# Patient Record
Sex: Female | Born: 1985 | Race: White | Hispanic: No | Marital: Married | Smoking: Never smoker
Health system: Southern US, Community
[De-identification: ages and names within clinical notes are randomized; demographics above are authoritative.]

## PROBLEM LIST (undated history)

## (undated) DIAGNOSIS — B379 Candidiasis, unspecified: Secondary | ICD-10-CM

## (undated) DIAGNOSIS — Z789 Other specified health status: Secondary | ICD-10-CM

## (undated) HISTORY — PX: NO PAST SURGERIES: SHX2092

---

## 2012-10-03 LAB — OB RESULTS CONSOLE GC/CHLAMYDIA
Chlamydia: NEGATIVE
Gonorrhea: NEGATIVE

## 2012-10-03 LAB — OB RESULTS CONSOLE ANTIBODY SCREEN: Antibody Screen: NEGATIVE

## 2012-10-03 LAB — OB RESULTS CONSOLE RUBELLA ANTIBODY, IGM: Rubella: IMMUNE

## 2012-12-07 NOTE — L&D Delivery Note (Signed)
Delivery Note At 11:08 AM a healthy female was delivered via Vaginal, Spontaneous Delivery (Presentation: Right Occiput Anterior).  APGAR: 8, 9; weight .   Placenta status: Intact, Spontaneous.  Cord: 3 vessels with the following complications: None.  Cord pH: none  Anesthesia: None  Episiotomy: None Lacerations: 2nd degree;Perineal Suture Repair: vicryl rapide Est. Blood Loss (mL):   Mom to postpartum.  Baby to nursery-stable.  Betsabe Iglesia,MARIE-LYNE 05/04/2013, 11:52 AM

## 2013-01-20 ENCOUNTER — Inpatient Hospital Stay (HOSPITAL_COMMUNITY): Admission: AD | Admit: 2013-01-20 | Payer: Self-pay | Source: Ambulatory Visit | Admitting: Obstetrics

## 2013-03-31 LAB — OB RESULTS CONSOLE GBS: GBS: NEGATIVE

## 2013-05-04 ENCOUNTER — Encounter (HOSPITAL_COMMUNITY): Payer: Self-pay | Admitting: Obstetrics and Gynecology

## 2013-05-04 ENCOUNTER — Inpatient Hospital Stay (HOSPITAL_COMMUNITY)
Admission: AD | Admit: 2013-05-04 | Discharge: 2013-05-06 | DRG: 372 | Disposition: A | Discharge status: Home / Self Care | Payer: BC Managed Care – PPO | Source: Ambulatory Visit | Attending: Obstetrics & Gynecology | Admitting: Obstetrics & Gynecology

## 2013-05-04 DIAGNOSIS — D62 Acute posthemorrhagic anemia: Secondary | ICD-10-CM | POA: Diagnosis not present

## 2013-05-04 DIAGNOSIS — O9903 Anemia complicating the puerperium: Secondary | ICD-10-CM | POA: Diagnosis not present

## 2013-05-04 DIAGNOSIS — O3660X0 Maternal care for excessive fetal growth, unspecified trimester, not applicable or unspecified: Secondary | ICD-10-CM | POA: Diagnosis present

## 2013-05-04 DIAGNOSIS — O99814 Abnormal glucose complicating childbirth: Secondary | ICD-10-CM | POA: Diagnosis present

## 2013-05-04 HISTORY — DX: Other specified health status: Z78.9

## 2013-05-04 LAB — CBC
Platelets: 124 10*3/uL — ABNORMAL LOW (ref 150–400)
RBC: 4.95 MIL/uL (ref 3.87–5.11)
RDW: 15 % (ref 11.5–15.5)
WBC: 10.8 10*3/uL — ABNORMAL HIGH (ref 4.0–10.5)

## 2013-05-04 LAB — RPR: RPR Ser Ql: NONREACTIVE

## 2013-05-04 MED ORDER — IBUPROFEN 600 MG PO TABS
600.0000 mg | ORAL_TABLET | Freq: Four times a day (QID) | ORAL | Status: DC
  Administered 2013-05-04 – 2013-05-06 (×7): 600 mg via ORAL
  Filled 2013-05-04 (×7): qty 1

## 2013-05-04 MED ORDER — ONDANSETRON HCL 4 MG/2ML IJ SOLN: 4.0000 mg | INTRAMUSCULAR | Status: DC | PRN

## 2013-05-04 MED ORDER — DIBUCAINE 1 % RE OINT
1.0000 "application " | TOPICAL_OINTMENT | RECTAL | Status: DC | PRN
  Administered 2013-05-04: 1 via RECTAL
  Filled 2013-05-04: qty 28

## 2013-05-04 MED ORDER — SIMETHICONE 80 MG PO CHEW: 80.0000 mg | CHEWABLE_TABLET | ORAL | Status: DC | PRN

## 2013-05-04 MED ORDER — OXYTOCIN BOLUS FROM INFUSION: 500.0000 mL | INTRAVENOUS | Status: DC

## 2013-05-04 MED ORDER — OXYCODONE-ACETAMINOPHEN 5-325 MG PO TABS: 1.0000 | ORAL_TABLET | ORAL | Status: DC | PRN

## 2013-05-04 MED ORDER — TETANUS-DIPHTH-ACELL PERTUSSIS 5-2.5-18.5 LF-MCG/0.5 IM SUSP: 0.5000 mL | Freq: Once | INTRAMUSCULAR | Status: DC

## 2013-05-04 MED ORDER — LIDOCAINE HCL (PF) 1 % IJ SOLN
30.0000 mL | INTRAMUSCULAR | Status: AC | PRN
  Filled 2013-05-04: qty 30

## 2013-05-04 MED ORDER — OXYTOCIN 10 UNIT/ML IJ SOLN
INTRAMUSCULAR | Status: AC
  Administered 2013-05-04: 20 [IU] via INTRAMUSCULAR
  Filled 2013-05-04: qty 2

## 2013-05-04 MED ORDER — LANOLIN HYDROUS EX OINT: TOPICAL_OINTMENT | CUTANEOUS | Status: DC | PRN

## 2013-05-04 MED ORDER — IBUPROFEN 600 MG PO TABS
600.0000 mg | ORAL_TABLET | Freq: Four times a day (QID) | ORAL | Status: DC | PRN
  Administered 2013-05-04: 600 mg via ORAL
  Filled 2013-05-04: qty 1

## 2013-05-04 MED ORDER — ACETAMINOPHEN 325 MG PO TABS: 650.0000 mg | ORAL_TABLET | ORAL | Status: DC | PRN

## 2013-05-04 MED ORDER — SENNOSIDES-DOCUSATE SODIUM 8.6-50 MG PO TABS: 2.0000 | ORAL_TABLET | Freq: Every day | ORAL | Status: DC

## 2013-05-04 MED ORDER — DIPHENHYDRAMINE HCL 25 MG PO CAPS: 25.0000 mg | ORAL_CAPSULE | Freq: Four times a day (QID) | ORAL | Status: DC | PRN

## 2013-05-04 MED ORDER — ZOLPIDEM TARTRATE 5 MG PO TABS: 5.0000 mg | ORAL_TABLET | Freq: Every evening | ORAL | Status: DC | PRN

## 2013-05-04 MED ORDER — PRENATAL MULTIVITAMIN CH
1.0000 | ORAL_TABLET | Freq: Every day | ORAL | Status: DC
  Administered 2013-05-05: 1 via ORAL
  Filled 2013-05-04: qty 1

## 2013-05-04 MED ORDER — LIDOCAINE HCL (PF) 1 % IJ SOLN
INTRAMUSCULAR | Status: AC
  Administered 2013-05-04: 30 mL via SUBCUTANEOUS
  Filled 2013-05-04: qty 30

## 2013-05-04 MED ORDER — ONDANSETRON HCL 4 MG/2ML IJ SOLN: 4.0000 mg | Freq: Four times a day (QID) | INTRAMUSCULAR | Status: DC | PRN

## 2013-05-04 MED ORDER — BENZOCAINE-MENTHOL 20-0.5 % EX AERO
1.0000 "application " | INHALATION_SPRAY | CUTANEOUS | Status: DC | PRN
  Administered 2013-05-04: 1 via TOPICAL
  Filled 2013-05-04: qty 56

## 2013-05-04 MED ORDER — OXYTOCIN 40 UNITS IN LACTATED RINGERS INFUSION - SIMPLE MED
INTRAVENOUS | Status: AC
  Filled 2013-05-04: qty 1000

## 2013-05-04 MED ORDER — OXYTOCIN 40 UNITS IN LACTATED RINGERS INFUSION - SIMPLE MED: 62.5000 mL/h | INTRAVENOUS | Status: DC | PRN

## 2013-05-04 MED ORDER — ONDANSETRON HCL 4 MG PO TABS: 4.0000 mg | ORAL_TABLET | ORAL | Status: DC | PRN

## 2013-05-04 MED ORDER — LACTATED RINGERS IV SOLN
INTRAVENOUS | Status: DC
  Administered 2013-05-04: 11:00:00 via INTRAVENOUS

## 2013-05-04 MED ORDER — LACTATED RINGERS IV SOLN: 500.0000 mL | INTRAVENOUS | Status: DC | PRN

## 2013-05-04 MED ORDER — OXYTOCIN 40 UNITS IN LACTATED RINGERS INFUSION - SIMPLE MED: 62.5000 mL/h | INTRAVENOUS | Status: DC

## 2013-05-04 MED ORDER — CITRIC ACID-SODIUM CITRATE 334-500 MG/5ML PO SOLN: 30.0000 mL | ORAL | Status: DC | PRN

## 2013-05-04 MED ORDER — WITCH HAZEL-GLYCERIN EX PADS
1.0000 "application " | MEDICATED_PAD | CUTANEOUS | Status: DC | PRN
  Administered 2013-05-04: 1 via TOPICAL

## 2013-05-04 MED ORDER — FLEET ENEMA 7-19 GM/118ML RE ENEM: 1.0000 | ENEMA | RECTAL | Status: DC | PRN

## 2013-05-04 NOTE — Progress Notes (Signed)
1915 BP 143/97 No headaches or blurred vision or any other complaints, 2054 Recheck BP 138/96 Contacted Dr. Cherly Hensen, no orders given.  Will continue to monitor. Dahlia Byes Boschen

## 2013-05-04 NOTE — H&P (Signed)
Amanda Shannon is a 27 y.o. female G2P1 110w3d presenting for Spontaneous labor, advanced dilation.  HPP:  GDMA1 well controled.  OB History   Grav Para Term Preterm Abortions TAB SAB Ect Mult Living   2 1             Past Medical History  Diagnosis Date  . Medical history non-contributory    Past Surgical History  Procedure Laterality Date  . No past surgeries     Family History: family history is not on file. Social History:  has no tobacco, alcohol, and drug history on file. Current facility-administered medications:lidocaine (PF) (XYLOCAINE) 1 % injection, , , , ;  oxytocin 40 units in LR 1000 mL 40 units/1000 mL infusion, , , ,  Allergies  Allergen Reactions  . Amoxicillin Rash    Dilation: 9 Effacement (%): 90 Station: -1 Exam by:: Dr. Seymour Bars Blood pressure 129/77, pulse 89, temperature 97.7 F (36.5 C), temperature source Oral, resp. rate 20.  Just reexamined, VE 9+/100%/Vtx/0  AROM AF clear.  HPP: There are no active problems to display for this patient.   Prenatal labs: ABO, Rh:  pos Antibody:  Neg Rubella:  Immune RPR:  NR HBsAg:  NR  HIV:  NR  Genetic testing: wnl [place] anato: Isolated EIF, anato detailed wnl. GDM diet, well controled GBS:  neg  Assessment/Plan: 40+ wks active labor, advanced dilation.  AROM clear AF.  F-well being reassuring. H/O macrosomia with SVD.  GDMA1 well controled.  Preparing for VD.   Ilze Roselli,MARIE-LYNE 05/04/2013, 10:55 AM

## 2013-05-04 NOTE — Progress Notes (Signed)
Education done on skin to skin for one hour and she still requested for the baby to be weighed and measured before she tried to breast feed

## 2013-05-05 LAB — ABO/RH: ABO/RH(D): O POS

## 2013-05-05 LAB — CBC
Hemoglobin: 10.9 g/dL — ABNORMAL LOW (ref 12.0–15.0)
MCH: 27.9 pg (ref 26.0–34.0)
MCV: 82.8 fL (ref 78.0–100.0)
RBC: 3.9 MIL/uL (ref 3.87–5.11)
WBC: 12.6 10*3/uL — ABNORMAL HIGH (ref 4.0–10.5)

## 2013-05-05 NOTE — Progress Notes (Addendum)
PPD # 1 S/p SVD with 2nd deg lac. Had episode of isolated mild hypertension yesterday that is resolved this AM. Subjective: Pt reports feeling well/ Pain controlled with ibuprofen (OTC) Tolerating po/ Voiding without problems/ No n/v Bleeding is light Newborn info:  Information for the patient's newborn:  Amie, Cowens [[id number]]  female Feeding: breast   Objective:  VS: Blood pressure 105/68, pulse 71, temperature 98.6 F (37 C), temperature source Oral, resp. rate 18, height 5\' 8"  (1.727 m), weight 170 lb (77.111 kg), SpO2 100.00%, unknown if currently breastfeeding.    Recent Labs  05/04/13 1040 05/05/13 0605  WBC 10.8* 12.6*  HGB 13.9 10.9*  HCT 40.5 32.3*  PLT 124* 153    Blood type: --/--/O POS (05/29 1040) Rubella: Immune (10/28 0000)    Physical Exam:  General:  alert, cooperative and no distress CV: Regular rate and rhythm Resp: clear Abdomen: soft, nontender, normal bowel sounds Uterine Fundus: firm, below umbilicus, nontender Perineum: healing with good reapproximation Lochia: minimal Ext: edema trace and Homans sign is negative, no sign of DVT   A/P: PPD # 1/ G3P2012/ S/P:spontaneous vaginal with 2nd deg lac Mild ABL anemia Doing well Continue routine post partum orders Anticipate D/C home in AM    TEDDER, Elisha Headland, RN, FNP Student   05/05/2013, 9:13 AM  Reviewed: Arlana Lindau, RN, [facility] 05/05/13

## 2013-05-06 ENCOUNTER — Ambulatory Visit: Payer: Self-pay

## 2013-05-06 MED ORDER — IBUPROFEN 600 MG PO TABS: 600.0000 mg | ORAL_TABLET | Freq: Four times a day (QID) | ORAL | Status: DC

## 2013-05-06 MED ORDER — OXYCODONE-ACETAMINOPHEN 5-325 MG PO TABS
1.0000 | ORAL_TABLET | ORAL | Status: DC | PRN
Start: 2013-05-06 — End: 2013-08-15

## 2013-05-06 NOTE — Progress Notes (Signed)
PPD 2 SVD  S:  Reports feeling ok - tired mostly             Tolerating po/ No nausea or vomiting             Bleeding is light             Pain controlled with motrin and percocet             Up ad lib / ambulatory / voiding QS  Newborn breast feeding  / female O:               VS: BP 92/59  Pulse 71  Temp(Src) 97.8 F (36.6 C) (Oral)  Resp 20  Ht 5\' 8"  (1.727 m)  Wt 77.111 kg (170 lb)  BMI 25.85 kg/m2  SpO2 100%   LABS:  Recent Labs  05/04/13 1040 05/05/13 0605  WBC 10.8* 12.6*  HGB 13.9 10.9*  PLT 124* 153                          Physical Exam:             Alert and oriented X3  Abdomen: soft, non-tender, non-distended              Fundus: firm, non-tender, U-2  Perineum: mild edema only  Lochia: light  Extremities: no edema, no calf pain or tenderness    A: PPD # 2   Doing well - stable status  P:  Routine post partum orders  Discharge to home - WOB booklet - instructions reviewed  Marlinda Mike CNM, MSN, [facility] 05/06/2013, 9:27 AM

## 2013-05-06 NOTE — Discharge Summary (Signed)
Obstetric Discharge Summary  Reason for Admission: onset of labor Prenatal Procedures: none Intrapartum Procedures: spontaneous vaginal delivery Postpartum Procedures: none Complications-Operative and Postpartum: 2nd degree perineal laceration Hemoglobin  Date Value Range Status  05/05/2013 10.9* 12.0 - 15.0 g/dL Final     DELTA CHECK NOTED     REPEATED TO VERIFY     HCT  Date Value Range Status  05/05/2013 32.3* 36.0 - 46.0 % Final    Physical Exam:  General: alert, cooperative and no distress Lochia: appropriate Uterine Fundus: firm Incision: healing well DVT Evaluation: No evidence of DVT seen on physical exam.  Discharge Diagnoses: Term Pregnancy-delivered  Discharge Information: Date: 05/06/2013 Activity: pelvic rest Diet: routine Medications: PNV, Ibuprofen and Percocet Condition: stable Instructions: refer to practice specific booklet Discharge to: home Follow-up Information   Follow up with LAVOIE,MARIE-LYNE, MD. Schedule an appointment as soon as possible for a visit in 6 weeks.   Contact information:   [address] [place] [address] [phone]       Newborn Data: Live born female  Birth Weight: 9 lb 4 oz (4196 g) APGAR: 8, 9  Home with mother.  Marlinda Mike 05/06/2013, 9:31 AM

## 2013-05-06 NOTE — Lactation Note (Signed)
This note was copied from the chart of Amanda Shannon. Lactation Consultation Note  Patient Name: Amanda Enriqueta Augusta NWGNF'[id number] Date: 05/06/2013 Reason for consult: Follow-up assessment   Maternal Data    Feeding   LATCH Score/Interventions    Lactation Tools Discussed/Used     Consult Status Consult Status: Complete  Mom reports that nipples are pretty tender but intact. Comfort gels given with instructions for use and cleaning. No questions at present. To call prn  Pamelia Hoit 05/06/2013, 11:43 AM

## 2013-05-12 NOTE — Discharge Summary (Signed)
   Obstetric Discharge Summary Reason for Admission: onset of labor Prenatal Procedures: none Intrapartum Procedures: spontaneous vaginal delivery Postpartum Procedures: none Complications-Operative and Postpartum: none    Hemoglobin  Date Value Range Status  05/05/2013 10.9* 12.0 - 15.0 g/dL Final     DELTA CHECK NOTED     REPEATED TO VERIFY     HCT  Date Value Range Status  05/05/2013 32.3* 36.0 - 46.0 % Final    Hospital Course:  SVD, no complication.  Discharge Diagnoses: Term Pregnancy-delivered  Discharge Information: Date: 05/12/2013 Activity: unrestricted Diet: routine Medications:    Medication List    STOP taking these medications       calcium carbonate 500 MG chewable tablet  Commonly known as:  TUMS - dosed in mg elemental calcium      TAKE these medications       CRANBERRY PO  Take 2 tablets by mouth daily as needed (To prevent UTI).     ibuprofen 600 MG tablet  Commonly known as:  ADVIL,MOTRIN  Take 1 tablet (600 mg total) by mouth every 6 (six) hours.     oxyCODONE-acetaminophen 5-325 MG per tablet  Commonly known as:  PERCOCET/ROXICET  Take 1 tablet by mouth every 4 (four) hours as needed.     prenatal multivitamin Tabs  Take 1 tablet by mouth daily at 12 noon.       Condition: stable Instructions: refer to practice specific booklet Discharge to: home     Follow-up Information   Follow up with Bessy Reaney,MARIE-LYNE, MD. Schedule an appointment as soon as possible for a visit in 6 weeks.   Contact information:   [address] [place] [address] [phone]       Newborn Data: Live born  Information for the patient's newborn:  Natoria, Archibald [[id number]]  female ; APGAR 8 , 9 ; weight  Home with mother.  Jaben Benegas,MARIE-LYNE 05/12/2013, 11:31 AM

## 2013-08-15 ENCOUNTER — Emergency Department (HOSPITAL_COMMUNITY)
Admission: EM | Admit: 2013-08-15 | Discharge: 2013-08-15 | Disposition: A | Discharge status: Home / Self Care | Payer: BC Managed Care – PPO | Attending: Emergency Medicine | Admitting: Emergency Medicine

## 2013-08-15 ENCOUNTER — Encounter (HOSPITAL_COMMUNITY): Payer: Self-pay | Admitting: *Deleted

## 2013-08-15 DIAGNOSIS — R10816 Epigastric abdominal tenderness: Secondary | ICD-10-CM | POA: Insufficient documentation

## 2013-08-15 DIAGNOSIS — K219 Gastro-esophageal reflux disease without esophagitis: Secondary | ICD-10-CM | POA: Insufficient documentation

## 2013-08-15 DIAGNOSIS — Z880 Allergy status to penicillin: Secondary | ICD-10-CM | POA: Insufficient documentation

## 2013-08-15 DIAGNOSIS — R12 Heartburn: Secondary | ICD-10-CM

## 2013-08-15 NOTE — ED Notes (Signed)
Pt reports to ed with c/o possible allergic reaction to something she ate this morning. Pt sts as soon she was finished eating she felt "like something was wrong immediately it felt like some tightness in my chest". Pt denies SOB, difficulty breathing, mouth or tongue swelling. Pt has no known food allergies and sts she had peppers, bacon, sour cream and cottage cheese this morning. VSS pt in NAD.

## 2013-08-15 NOTE — ED Provider Notes (Signed)
CSN: [id number]     Arrival date & time 08/15/13  1109 History   First MD Initiated Contact with Patient 08/15/13 1128     No chief complaint on file.  (Consider location/radiation/quality/duration/timing/severity/associated sxs/prior Treatment) HPI Comments: 27 year old healthy female presents to the emergency department concerns of an allergic reaction to something that she ate this morning. Patient states she was at a function when she ate peppers, bacon, sour cream and cottage cheese, and immediately she began feeling a discomfort in the Center for chest. She felt as if she has to move her bowels, went to the bathroom and her symptoms were slightly relieved. Currently she is asymptomatic. Denies difficulty breathing, swallowing, tongue swelling, rash. She has never had any allergic reaction to food in the past. One week ago she was eating [place] food and had the exact same symptoms. States she had heartburn when she was pregnant with her children, however this feels a little different.  The history is provided by the patient.    Past Medical History  Diagnosis Date  . Medical history non-contributory    Past Surgical History  Procedure Laterality Date  . No past surgeries     History reviewed. No pertinent family history. History  Substance Use Topics  . Smoking status: Never Smoker   . Smokeless tobacco: Not on file  . Alcohol Use: No   OB History   Grav Para Term Preterm Abortions TAB SAB Ect Mult Living   2 2 1       1      Review of Systems  Constitutional: Negative for fever and chills.  HENT: Negative for sore throat, facial swelling, trouble swallowing and voice change.   Respiratory: Negative for shortness of breath, wheezing and stridor.   Cardiovascular: Negative for palpitations.       Positive for chest discomfort.  Gastrointestinal: Negative for nausea, vomiting and abdominal pain.  Musculoskeletal: Negative for back pain.  Skin: Negative for rash.  All other  systems reviewed and are negative.    Allergies  Amoxicillin  Home Medications   Current Outpatient Rx  Name  Route  Sig  Dispense  Refill  . diphenhydrAMINE (BENADRYL) 12.5 MG chewable tablet   Oral   Chew 25 mg by mouth 4 (four) times daily as needed for allergies (allergies).         Marland Kitchen ibuprofen (ADVIL,MOTRIN) 600 MG tablet   Oral   Take 1 tablet (600 mg total) by mouth every 6 (six) hours.   30 tablet   0    BP 122/78  Pulse 71  Temp(Src) 97.9 F (36.6 C) (Oral)  Resp 16  SpO2 100%  Breastfeeding? Yes Physical Exam  Nursing note and vitals reviewed. Constitutional: She is oriented to person, place, and time. She appears well-developed and well-nourished. No distress.  HENT:  Head: Normocephalic and atraumatic.  Mouth/Throat: Uvula is midline and oropharynx is clear and moist.  Eyes: Conjunctivae are normal.  Neck: Normal range of motion. Neck supple.  Cardiovascular: Normal rate, regular rhythm and normal heart sounds.   Pulmonary/Chest: Effort normal and breath sounds normal.  Abdominal: Soft. Normal appearance and bowel sounds are normal. She exhibits no distension and no mass. There is tenderness (mild) in the epigastric area. There is no rigidity, no rebound and no guarding.  No peritoneal signs.  Musculoskeletal: Normal range of motion. She exhibits no edema.  Neurological: She is alert and oriented to person, place, and time.  Skin: Skin is warm and dry. No  rash noted. She is not diaphoretic.  Psychiatric: She has a normal mood and affect. Her behavior is normal.    ED Course  Procedures (including critical care time) Labs Review Labs Reviewed - No data to display Imaging Review No results found.  MDM   1. GERD (gastroesophageal reflux disease)   2. Heartburn    Patient with chest discomfort after eating earlier this morning. History of the same one week ago after eating [place] food. She was concerned of an allergic reaction, however no rash,  difficulty breathing, difficulty swallowing, facial swelling, tongue swelling. Her symptoms are consistent with heartburn. I advised her to take over-the-counter PPI or TUMS. I discussed the diet for heartburn. Return precautions discussed. Patient states understanding of plan and is agreeable.   Trevor Mace, PA-C 08/15/13 1209

## 2013-08-15 NOTE — ED Provider Notes (Signed)
Medical screening examination/treatment/procedure(s) were performed by non-physician practitioner and as supervising physician I was immediately available for consultation/collaboration. Devoria Albe, MD, Armando Gang   Ward Givens, MD 08/15/13 [phone]

## 2014-10-08 ENCOUNTER — Encounter (HOSPITAL_COMMUNITY): Payer: Self-pay | Admitting: *Deleted

## 2016-12-22 DIAGNOSIS — Z23 Encounter for immunization: Secondary | ICD-10-CM | POA: Diagnosis not present

## 2017-01-12 DIAGNOSIS — H11133 Conjunctival pigmentations, bilateral: Secondary | ICD-10-CM | POA: Diagnosis not present

## 2017-07-12 DIAGNOSIS — K219 Gastro-esophageal reflux disease without esophagitis: Secondary | ICD-10-CM | POA: Diagnosis not present

## 2017-07-12 DIAGNOSIS — R42 Dizziness and giddiness: Secondary | ICD-10-CM | POA: Diagnosis not present

## 2017-07-12 DIAGNOSIS — E860 Dehydration: Secondary | ICD-10-CM | POA: Diagnosis not present

## 2017-08-11 DIAGNOSIS — E738 Other lactose intolerance: Secondary | ICD-10-CM | POA: Diagnosis not present

## 2017-08-11 DIAGNOSIS — Z6820 Body mass index (BMI) 20.0-20.9, adult: Secondary | ICD-10-CM | POA: Diagnosis not present

## 2017-08-11 DIAGNOSIS — K219 Gastro-esophageal reflux disease without esophagitis: Secondary | ICD-10-CM | POA: Diagnosis not present

## 2017-11-22 DIAGNOSIS — E559 Vitamin D deficiency, unspecified: Secondary | ICD-10-CM | POA: Diagnosis not present

## 2017-11-22 DIAGNOSIS — Z0000 Encounter for general adult medical examination without abnormal findings: Secondary | ICD-10-CM | POA: Diagnosis not present

## 2017-11-24 DIAGNOSIS — Z01419 Encounter for gynecological examination (general) (routine) without abnormal findings: Secondary | ICD-10-CM | POA: Diagnosis not present

## 2017-11-24 DIAGNOSIS — Z0000 Encounter for general adult medical examination without abnormal findings: Secondary | ICD-10-CM | POA: Diagnosis not present

## 2017-11-24 DIAGNOSIS — Z6820 Body mass index (BMI) 20.0-20.9, adult: Secondary | ICD-10-CM | POA: Diagnosis not present

## 2017-11-24 DIAGNOSIS — Z118 Encounter for screening for other infectious and parasitic diseases: Secondary | ICD-10-CM | POA: Diagnosis not present

## 2018-01-14 DIAGNOSIS — H11133 Conjunctival pigmentations, bilateral: Secondary | ICD-10-CM | POA: Diagnosis not present

## 2018-05-23 ENCOUNTER — Ambulatory Visit (HOSPITAL_COMMUNITY)
Admission: EM | Admit: 2018-05-23 | Discharge: 2018-05-23 | Disposition: A | Discharge status: Home / Self Care | Payer: 59 | Attending: Internal Medicine | Admitting: Internal Medicine

## 2018-05-23 ENCOUNTER — Encounter (HOSPITAL_COMMUNITY): Payer: Self-pay | Admitting: Family Medicine

## 2018-05-23 DIAGNOSIS — R42 Dizziness and giddiness: Secondary | ICD-10-CM

## 2018-05-23 DIAGNOSIS — R110 Nausea: Secondary | ICD-10-CM

## 2018-05-23 DIAGNOSIS — R112 Nausea with vomiting, unspecified: Secondary | ICD-10-CM

## 2018-05-23 DIAGNOSIS — Z3202 Encounter for pregnancy test, result negative: Secondary | ICD-10-CM

## 2018-05-23 LAB — POCT I-STAT, CHEM 8
BUN: 9 mg/dL (ref 6–20)
CALCIUM ION: 1.22 mmol/L (ref 1.15–1.40)
CREATININE: 0.7 mg/dL (ref 0.44–1.00)
Chloride: 106 mmol/L (ref 101–111)
GLUCOSE: 92 mg/dL (ref 65–99)
HCT: 42 % (ref 36.0–46.0)
HEMOGLOBIN: 14.3 g/dL (ref 12.0–15.0)
Potassium: 4.2 mmol/L (ref 3.5–5.1)
Sodium: 141 mmol/L (ref 135–145)
TCO2: 23 mmol/L (ref 22–32)

## 2018-05-23 LAB — POCT PREGNANCY, URINE: PREG TEST UR: NEGATIVE

## 2018-05-23 MED ORDER — MECLIZINE HCL 25 MG PO TABS: 12.5000 mg | ORAL_TABLET | Freq: Three times a day (TID) | ORAL | 0 refills | Status: DC | PRN

## 2018-05-23 NOTE — ED Provider Notes (Addendum)
MRN: [id number] DOB: [birth year]  Subjective:   Amanda Shannon is a 32 y.o. female presenting for 2 day history of constant dizziness, nausea without vomiting. Has had mild loose stools today but admits that she has a hard time with dairy, ate some last night. Has had mild chest pain, slight heart fluttering. Denies fever, vomiting, throat pain, cough, rashes, vertigo, tinnitus, ear pain. Has been hydrating aggressively, feels like this has cause belly pain.  Admits that she has a difficult time hydrating well.  Has not tried any medications for relief. LMP was last week 05/17/2018, was regular. Had a similar episode of dizziness last year.   No current facility-administered medications for this encounter.   Current Outpatient Medications:  .  diphenhydrAMINE (BENADRYL) 12.5 MG chewable tablet, Chew 25 mg by mouth 4 (four) times daily as needed for allergies (allergies)., Disp: , Rfl:    Allergies  Allergen Reactions  . Amoxicillin Rash    Past Medical History:  Diagnosis Date  . Medical history non-contributory      Past Surgical History:  Procedure Laterality Date  . NO PAST SURGERIES      Objective:   Vitals: BP 125/74   Pulse 80   Temp 98.4 F (36.9 C)   Resp 18   LMP 05/17/2018   SpO2 100%   Breastfeeding? No   BP Readings from Last 3 Encounters:  05/23/18 125/74  08/15/13 122/78  05/06/13 (!) 92/59    Orthostatic Vital Signs BP- Lying:119/87 Pulse- Lying:72 BP- Sitting:121/85 Pulse- Sitting:75 BP- Standing at 0 minutes:117/87 Pulse- Standing at 0 minutes:74  Physical Exam  Constitutional: She is oriented to person, place, and time. She appears well-developed and well-nourished.  HENT:  Mouth/Throat: Oropharynx is clear and moist.  Eyes: No scleral icterus.  Cardiovascular: Normal rate, regular rhythm, normal heart sounds and intact distal pulses. Exam reveals no gallop and no friction rub.  No murmur heard. Pulmonary/Chest: Effort normal and breath  sounds normal. No respiratory distress. She has no wheezes. She has no rales.  Neurological: She is alert and oriented to person, place, and time.  Skin: Skin is warm and dry.  Psychiatric: She has a normal mood and affect.   Results for orders placed or performed during the hospital encounter of 05/23/18 (from the past 24 hour(s))  Pregnancy, urine POC     Status: None   Collection Time: 05/23/18  4:57 PM  Result Value Ref Range   Preg Test, Ur NEGATIVE NEGATIVE  I-STAT, chem 8     Status: None   Collection Time: 05/23/18  5:21 PM  Result Value Ref Range   Sodium 141 135 - 145 mmol/L   Potassium 4.2 3.5 - 5.1 mmol/L   Chloride 106 101 - 111 mmol/L   BUN 9 6 - 20 mg/dL   Creatinine, Ser 0.70 0.44 - 1.00 mg/dL   Glucose, Bld 92 65 - 99 mg/dL   Calcium, Ion 1.22 1.15 - 1.40 mmol/L   TCO2 23 22 - 32 mmol/L   Hemoglobin 14.3 12.0 - 15.0 g/dL   HCT 42.0 36.0 - 46.0 %   Assessment and Plan :   Dizziness  Nausea without vomiting  Zickel exam findings and vitals are very reassuring, point-of-care test normal.  Recommended patient rest and hydrate well.  For supportive care will use meclizine. Counseled patient on potential for adverse effects with medications prescribed today, patient verbalized understanding.  Patient is to follow-up with PCP.   Jaynee Eagles, PA-C 05/23/18 1747  Jaynee Eagles, [place] 05/23/18 [phone]

## 2018-05-23 NOTE — ED Triage Notes (Signed)
Pt here for dizziness for the past few days. She had a recent trip to the beach and this started after. Denies any pain, vomiting or diarrhea. Denies fever. She has been eating and drinking okay. She has been nauseous.

## 2018-05-23 NOTE — Discharge Instructions (Signed)
Hydrate well with at least 2 liters (1 gallon) of water daily.  °

## 2018-05-26 DIAGNOSIS — R42 Dizziness and giddiness: Secondary | ICD-10-CM | POA: Diagnosis not present

## 2018-06-29 DIAGNOSIS — Z6822 Body mass index (BMI) 22.0-22.9, adult: Secondary | ICD-10-CM | POA: Diagnosis not present

## 2018-07-14 DIAGNOSIS — J069 Acute upper respiratory infection, unspecified: Secondary | ICD-10-CM | POA: Diagnosis not present

## 2018-12-10 ENCOUNTER — Other Ambulatory Visit: Payer: Self-pay

## 2018-12-10 ENCOUNTER — Ambulatory Visit (HOSPITAL_COMMUNITY)
Admission: EM | Admit: 2018-12-10 | Discharge: 2018-12-10 | Disposition: A | Discharge status: Home / Self Care | Payer: 59 | Attending: Family Medicine | Admitting: Family Medicine

## 2018-12-10 ENCOUNTER — Encounter (HOSPITAL_COMMUNITY): Payer: Self-pay | Admitting: *Deleted

## 2018-12-10 DIAGNOSIS — B9789 Other viral agents as the cause of diseases classified elsewhere: Secondary | ICD-10-CM | POA: Insufficient documentation

## 2018-12-10 DIAGNOSIS — J069 Acute upper respiratory infection, unspecified: Secondary | ICD-10-CM | POA: Insufficient documentation

## 2018-12-10 MED ORDER — FLUTICASONE PROPIONATE 50 MCG/ACT NA SUSP: 2.0000 | Freq: Every day | NASAL | 0 refills | Status: DC

## 2018-12-10 MED ORDER — BENZONATATE 100 MG PO CAPS: 100.0000 mg | ORAL_CAPSULE | Freq: Three times a day (TID) | ORAL | 0 refills | Status: DC

## 2018-12-10 MED ORDER — IPRATROPIUM BROMIDE 0.06 % NA SOLN: 2.0000 | Freq: Four times a day (QID) | NASAL | 0 refills | Status: DC

## 2018-12-10 NOTE — ED Provider Notes (Signed)
[place]    CSN: [id number] Arrival date & time: 12/10/18  1723     History   Chief Complaint Chief Complaint  Patient presents with  . Cough    HPI Amanda Shannon is a 33 y.o. female.   33 year old female comes in for 4 to 5-day history of URI symptoms.  Has had rhinorrhea, nasal congestion, cough, voice hoarseness.  States today, feels tired, "woozy".  Has generalized body aches and mild dizziness.  Denies fever, chills, night sweats.  Denies abdominal pain, nausea, vomiting.  Has still been able to walk without difficulty.  Denies chest pain, shortness of breath or wheezing.  Has not taken anything for symptoms.     Past Medical History:  Diagnosis Date  . Medical history non-contributory     Patient Active Problem List   Diagnosis Date Noted  . SVD (spontaneous vaginal delivery) 05/04/2013  . Postpartum care following vaginal delivery (5/29) 05/04/2013    Past Surgical History:  Procedure Laterality Date  . NO PAST SURGERIES      OB History    Gravida  2   Para  2   Term  1   Preterm      AB      Living  1     SAB      TAB      Ectopic      Multiple      Live Births  1            Home Medications    Prior to Admission medications   Medication Sig Start Date End Date Taking? Authorizing Provider  benzonatate (TESSALON) 100 MG capsule Take 1 capsule (100 mg total) by mouth every 8 (eight) hours. 12/10/18   Tasia Catchings, Declan V, PA-C  diphenhydrAMINE (BENADRYL) 12.5 MG chewable tablet Chew 25 mg by mouth 4 (four) times daily as needed for allergies (allergies).    [provider]  fluticasone (FLONASE) 50 MCG/ACT nasal spray Place 2 sprays into both nostrils daily. 12/10/18   Tasia Catchings, Dioselina V, PA-C  ipratropium (ATROVENT) 0.06 % nasal spray Place 2 sprays into both nostrils 4 (four) times daily. 12/10/18   Ok Edwards, PA-C  meclizine (ANTIVERT) 25 MG tablet Take 0.5-1 tablets (12.5-25 mg total) by mouth 3 (three) times daily as needed for  dizziness. 05/23/18   Jaynee Eagles, PA-C    Family History No family history on file.  Social History Social History   Tobacco Use  . Smoking status: Never Smoker  . Smokeless tobacco: Never Used  Substance Use Topics  . Alcohol use: No  . Drug use: No     Allergies   Amoxicillin   Review of Systems Review of Systems  Reason unable to perform ROS: See HPI as above.     Physical Exam Triage Vital Signs ED Triage Vitals  Enc Vitals Group     BP 12/10/18 1825 131/75     Pulse Rate 12/10/18 1825 90     Resp 12/10/18 1825 20     Temp 12/10/18 1825 98.3 F (36.8 C)     Temp Source 12/10/18 1825 Oral     SpO2 12/10/18 1825 98 %     Weight --      Height --      Head Circumference --      Peak Flow --      Pain Score 12/10/18 1827 0     Pain Loc --      Pain  Edu? --      Excl. in [place]? --    No data found.  Updated Vital Signs BP 131/75 (BP Location: Right Arm)   Pulse 90   Temp 98.3 F (36.8 C) (Oral)   Resp 20   LMP 12/08/2018 (Exact Date)   SpO2 98%   Physical Exam Constitutional:      General: She is not in acute distress.    Appearance: She is well-developed. She is not ill-appearing, toxic-appearing or diaphoretic.  HENT:     Head: Normocephalic and atraumatic.     Right Ear: Tympanic membrane, ear canal and external ear normal. Tympanic membrane is not erythematous or bulging.     Left Ear: Tympanic membrane, ear canal and external ear normal. Tympanic membrane is not erythematous or bulging.     Nose: Nose normal.     Right Sinus: No maxillary sinus tenderness or frontal sinus tenderness.     Left Sinus: No maxillary sinus tenderness or frontal sinus tenderness.     Mouth/Throat:     Pharynx: Uvula midline.  Eyes:     Conjunctiva/sclera: Conjunctivae normal.     Pupils: Pupils are equal, round, and reactive to light.  Neck:     Musculoskeletal: Normal range of motion and neck supple.  Cardiovascular:     Rate and Rhythm: Normal rate and  regular rhythm.     Heart sounds: Normal heart sounds. No murmur. No friction rub. No gallop.   Pulmonary:     Effort: Pulmonary effort is normal.     Breath sounds: Normal breath sounds. No decreased breath sounds, wheezing, rhonchi or rales.  Lymphadenopathy:     Cervical: No cervical adenopathy.  Skin:    General: Skin is warm and dry.  Neurological:     Mental Status: She is alert and oriented to person, place, and time.  Psychiatric:        Behavior: Behavior normal.        Judgment: Judgment normal.      UC Treatments / Results  Labs (all labs ordered are listed, but only abnormal results are displayed) Labs Reviewed - No data to display  EKG None  Radiology No results found.  Procedures Procedures (including critical care time)  Medications Ordered in UC Medications - No data to display  Initial Impression / Assessment and Plan / UC Course  I have reviewed the triage vital signs and the nursing notes.  Pertinent labs & imaging results that were available during my care of the patient were reviewed by me and considered in my medical decision making (see chart for details).    Discussed with patient history and exam most consistent with viral URI. Symptomatic treatment as needed. Push fluids. Return precautions given.   Final Clinical Impressions(s) / UC Diagnoses   Final diagnoses:  Viral URI with cough    ED Prescriptions    Medication Sig Dispense Auth. Provider   fluticasone (FLONASE) 50 MCG/ACT nasal spray Place 2 sprays into both nostrils daily. 1 g Vania Rosero, Shakiera V, PA-C   ipratropium (ATROVENT) 0.06 % nasal spray Place 2 sprays into both nostrils 4 (four) times daily. 15 mL Eliasar Hlavaty, Sarayah V, PA-C   benzonatate (TESSALON) 100 MG capsule Take 1 capsule (100 mg total) by mouth every 8 (eight) hours. 21 capsule [place], [place], [place] 12/10/18 1909

## 2018-12-10 NOTE — ED Triage Notes (Signed)
C/o generalized body aches with cough onset 4-5 days ago.

## 2018-12-10 NOTE — Discharge Instructions (Signed)
Tessalon for cough. Start flonase, atrovent nasal spray for nasal congestion/drainage. You can use over the counter nasal saline rinse such as neti pot for nasal congestion. Keep hydrated, your urine should be clear to pale yellow in color. Tylenol/motrin for fever and pain. Monitor for any worsening of symptoms, chest pain, shortness of breath, wheezing, swelling of the throat, follow up for reevaluation.   For sore throat/cough try using a honey-based tea. Use 3 teaspoons of honey with juice squeezed from half lemon. Place shaved pieces of ginger into 1/2-1 cup of water and warm over stove top. Then mix the ingredients and repeat every 4 hours as needed.

## 2018-12-19 DIAGNOSIS — E559 Vitamin D deficiency, unspecified: Secondary | ICD-10-CM | POA: Diagnosis not present

## 2018-12-19 DIAGNOSIS — Z0000 Encounter for general adult medical examination without abnormal findings: Secondary | ICD-10-CM | POA: Diagnosis not present

## 2018-12-21 DIAGNOSIS — Z01419 Encounter for gynecological examination (general) (routine) without abnormal findings: Secondary | ICD-10-CM | POA: Diagnosis not present

## 2018-12-21 DIAGNOSIS — Z6820 Body mass index (BMI) 20.0-20.9, adult: Secondary | ICD-10-CM | POA: Diagnosis not present

## 2018-12-21 DIAGNOSIS — Z0000 Encounter for general adult medical examination without abnormal findings: Secondary | ICD-10-CM | POA: Diagnosis not present

## 2018-12-21 DIAGNOSIS — N760 Acute vaginitis: Secondary | ICD-10-CM | POA: Diagnosis not present

## 2018-12-21 DIAGNOSIS — Z118 Encounter for screening for other infectious and parasitic diseases: Secondary | ICD-10-CM | POA: Diagnosis not present

## 2019-02-01 DIAGNOSIS — D2361 Other benign neoplasm of skin of right upper limb, including shoulder: Secondary | ICD-10-CM | POA: Diagnosis not present

## 2019-02-01 DIAGNOSIS — D225 Melanocytic nevi of trunk: Secondary | ICD-10-CM | POA: Diagnosis not present

## 2019-02-01 DIAGNOSIS — D2262 Melanocytic nevi of left upper limb, including shoulder: Secondary | ICD-10-CM | POA: Diagnosis not present

## 2019-03-30 DIAGNOSIS — R42 Dizziness and giddiness: Secondary | ICD-10-CM | POA: Diagnosis not present

## 2019-03-30 DIAGNOSIS — Z719 Counseling, unspecified: Secondary | ICD-10-CM | POA: Diagnosis not present

## 2019-04-03 DIAGNOSIS — R42 Dizziness and giddiness: Secondary | ICD-10-CM | POA: Diagnosis not present

## 2019-04-07 DIAGNOSIS — E860 Dehydration: Secondary | ICD-10-CM | POA: Diagnosis not present

## 2019-04-07 DIAGNOSIS — R42 Dizziness and giddiness: Secondary | ICD-10-CM | POA: Diagnosis not present

## 2019-04-10 DIAGNOSIS — J309 Allergic rhinitis, unspecified: Secondary | ICD-10-CM | POA: Diagnosis not present

## 2019-04-10 DIAGNOSIS — E162 Hypoglycemia, unspecified: Secondary | ICD-10-CM | POA: Diagnosis not present

## 2019-04-10 DIAGNOSIS — R42 Dizziness and giddiness: Secondary | ICD-10-CM | POA: Diagnosis not present

## 2019-04-18 DIAGNOSIS — E162 Hypoglycemia, unspecified: Secondary | ICD-10-CM | POA: Diagnosis not present

## 2019-04-21 ENCOUNTER — Ambulatory Visit (INDEPENDENT_AMBULATORY_CARE_PROVIDER_SITE_OTHER): Payer: 59

## 2019-04-21 ENCOUNTER — Telehealth (HOSPITAL_COMMUNITY): Payer: Self-pay | Admitting: Emergency Medicine

## 2019-04-21 ENCOUNTER — Other Ambulatory Visit: Payer: Self-pay

## 2019-04-21 ENCOUNTER — Ambulatory Visit (HOSPITAL_COMMUNITY)
Admission: EM | Admit: 2019-04-21 | Discharge: 2019-04-21 | Disposition: A | Discharge status: Home / Self Care | Payer: 59 | Attending: Family Medicine | Admitting: Family Medicine

## 2019-04-21 ENCOUNTER — Encounter (HOSPITAL_COMMUNITY): Payer: Self-pay

## 2019-04-21 DIAGNOSIS — R42 Dizziness and giddiness: Secondary | ICD-10-CM | POA: Diagnosis not present

## 2019-04-21 DIAGNOSIS — Z3202 Encounter for pregnancy test, result negative: Secondary | ICD-10-CM | POA: Diagnosis not present

## 2019-04-21 DIAGNOSIS — R079 Chest pain, unspecified: Secondary | ICD-10-CM | POA: Insufficient documentation

## 2019-04-21 LAB — POCT PREGNANCY, URINE: Preg Test, Ur: NEGATIVE

## 2019-04-21 LAB — CBC WITH DIFFERENTIAL/PLATELET
Abs Immature Granulocytes: 0.04 10*3/uL (ref 0.00–0.07)
Basophils Absolute: 0 10*3/uL (ref 0.0–0.1)
Basophils Relative: 1 %
Eosinophils Absolute: 0.1 10*3/uL (ref 0.0–0.5)
Eosinophils Relative: 1 %
HCT: 44.5 % (ref 36.0–46.0)
Hemoglobin: 15.3 g/dL — ABNORMAL HIGH (ref 12.0–15.0)
Immature Granulocytes: 1 %
Lymphocytes Relative: 18 %
Lymphs Abs: 1 10*3/uL (ref 0.7–4.0)
MCH: 30.2 pg (ref 26.0–34.0)
MCHC: 34.4 g/dL (ref 30.0–36.0)
MCV: 87.8 fL (ref 80.0–100.0)
Monocytes Absolute: 0.3 10*3/uL (ref 0.1–1.0)
Monocytes Relative: 5 %
Neutro Abs: 4.3 10*3/uL (ref 1.7–7.7)
Neutrophils Relative %: 74 %
Platelets: 173 10*3/uL (ref 150–400)
RBC: 5.07 MIL/uL (ref 3.87–5.11)
RDW: 11.9 % (ref 11.5–15.5)
WBC: 5.7 10*3/uL (ref 4.0–10.5)
nRBC: 0 % (ref 0.0–0.2)

## 2019-04-21 LAB — COMPREHENSIVE METABOLIC PANEL
ALT: 15 U/L (ref 0–44)
AST: 18 U/L (ref 15–41)
Albumin: 4.3 g/dL (ref 3.5–5.0)
Alkaline Phosphatase: 43 U/L (ref 38–126)
Anion gap: 7 (ref 5–15)
BUN: 12 mg/dL (ref 6–20)
CO2: 25 mmol/L (ref 22–32)
Calcium: 9.1 mg/dL (ref 8.9–10.3)
Chloride: 107 mmol/L (ref 98–111)
Creatinine, Ser: 0.73 mg/dL (ref 0.44–1.00)
GFR calc Af Amer: >60 mL/min (ref >60)
GFR calc non Af Amer: >60 mL/min (ref >60)
Glucose, Bld: 98 mg/dL (ref 70–99)
Potassium: 3.9 mmol/L (ref 3.5–5.1)
Sodium: 139 mmol/L (ref 135–145)
Total Bilirubin: 1 mg/dL (ref 0.3–1.2)
Total Protein: 7.3 g/dL (ref 6.5–8.1)

## 2019-04-21 LAB — TSH: TSH: 2.455 u[IU]/mL (ref 0.350–4.500)

## 2019-04-21 NOTE — ED Provider Notes (Signed)
[place]    CSN: [id number] Arrival date & time: 04/21/19  7425     History   Chief Complaint Chief Complaint  Patient presents with   Dizziness   Fatigue    HPI Amanda Shannon is a 33 y.o. female.   Pt is a 33 year old female that presents with multiple symptoms. She has been not feeling well for over a month now. She has been having dizziness, weakness, intermittent chest pains on the left side. All of these symptoms come and go. She feels like her blood sugars have been dropping and when she eats carbohydrates she feels better. She has been trying to do low carb diet. She has been treating the dizziness with meclizine which helps with the symptoms but makes her very tired and she has 3 kids to take care of and 2 in home school. She admits to being under some stress. Denies specific anxiety or panic. She does have some mild swelling to the left chest wall but it is non tender to touch. No injury, heavy lifting. Denies any family hx of breast cancer. No hx of DVT, PE. No calf swelling or tenderness. She had physical in January that was normal. She takes vitamin D daily.   ROS per HPI      Past Medical History:  Diagnosis Date   Medical history non-contributory     Patient Active Problem List   Diagnosis Date Noted   SVD (spontaneous vaginal delivery) 05/04/2013   Postpartum care following vaginal delivery (5/29) 05/04/2013    Past Surgical History:  Procedure Laterality Date   NO PAST SURGERIES      OB History    Gravida  2   Para  2   Term  1   Preterm      AB      Living  1     SAB      TAB      Ectopic      Multiple      Live Births  1            Home Medications    Prior to Admission medications   Medication Sig Start Date End Date Taking? Authorizing Provider  meclizine (ANTIVERT) 25 MG tablet Take 0.5-1 tablets (12.5-25 mg total) by mouth 3 (three) times daily as needed for dizziness. 05/23/18  Yes Jaynee Eagles, PA-C    benzonatate (TESSALON) 100 MG capsule Take 1 capsule (100 mg total) by mouth every 8 (eight) hours. 12/10/18   Tasia Catchings, Eura V, PA-C  diphenhydrAMINE (BENADRYL) 12.5 MG chewable tablet Chew 25 mg by mouth 4 (four) times daily as needed for allergies (allergies).    [provider]  fluticasone (FLONASE) 50 MCG/ACT nasal spray Place 2 sprays into both nostrils daily. 12/10/18   Tasia Catchings, Doralyn V, PA-C  ipratropium (ATROVENT) 0.06 % nasal spray Place 2 sprays into both nostrils 4 (four) times daily. 12/10/18   Ok Edwards, PA-C    Family History History reviewed. No pertinent family history.  Social History Social History   Tobacco Use   Smoking status: Never Smoker   Smokeless tobacco: Never Used  Substance Use Topics   Alcohol use: No   Drug use: No     Allergies   Amoxicillin   Review of Systems Review of Systems   Physical Exam Triage Vital Signs ED Triage Vitals  Enc Vitals Group     BP 04/21/19 0927 (!) 131/98     Pulse  Rate 04/21/19 0927 94     Resp 04/21/19 0927 18     Temp 04/21/19 0927 98.6 F (37 C)     Temp Source 04/21/19 0927 Oral     SpO2 04/21/19 0927 100 %     Weight --      Height --      Head Circumference --      Peak Flow --      Pain Score 04/21/19 0924 0     Pain Loc --      Pain Edu? --      Excl. in [place]? --    No data found.  Updated Vital Signs BP (!) 131/98 (BP Location: Left Arm)    Pulse 94    Temp 98.6 F (37 C) (Oral)    Resp 18    LMP 03/24/2019    SpO2 100%   Visual Acuity Right Eye Distance:   Left Eye Distance:   Bilateral Distance:    Right Eye Near:   Left Eye Near:    Bilateral Near:     Physical Exam Vitals signs and nursing note reviewed.  Constitutional:      Appearance: She is not toxic-appearing.  HENT:     Head: Normocephalic and atraumatic.     Right Ear: Tympanic membrane and ear canal normal.     Left Ear: Tympanic membrane and ear canal normal.     Nose: Nose normal.     Mouth/Throat:     Pharynx:  Oropharynx is clear.  Eyes:     Extraocular Movements: Extraocular movements intact.     Conjunctiva/sclera: Conjunctivae normal.     Pupils: Pupils are equal, round, and reactive to light.  Neck:     Musculoskeletal: Normal range of motion and neck supple.  Cardiovascular:     Rate and Rhythm: Normal rate and regular rhythm.     Pulses: Normal pulses.     Heart sounds: Normal heart sounds.  Pulmonary:     Effort: Pulmonary effort is normal.     Breath sounds: Normal breath sounds.  Musculoskeletal: Normal range of motion.  Skin:    General: Skin is warm and dry.  Neurological:     General: No focal deficit present.     Mental Status: She is alert.  Psychiatric:     Comments: Somnolent       UC Treatments / Results  Labs (all labs ordered are listed, but only abnormal results are displayed) Labs Reviewed  CBC WITH DIFFERENTIAL/PLATELET - Abnormal; Notable for the following components:      Result Value   Hemoglobin 15.3 (*)    All other components within normal limits  COMPREHENSIVE METABOLIC PANEL  TSH  VITAMIN D 25 HYDROXY (VIT D DEFICIENCY, FRACTURES)  POC URINE PREG, ED  POCT PREGNANCY, URINE    EKG None  Radiology Dg Chest 2 View  Result Date: 04/21/2019 CLINICAL DATA:  33 year old female with chest pain, chest tightness on the right side for 1 week. EXAM: CHEST - 2 VIEW COMPARISON:  None. FINDINGS: Normal lung volumes and mediastinal contours. Visualized tracheal air column is within normal limits. Both lungs appear clear. No pneumothorax or pleural effusion. Negative visible bowel gas pattern and osseous structures. IMPRESSION: Negative.  No cardiopulmonary abnormality. Electronically Signed   By: Genevie Ann M.D.   On: 04/21/2019 10:57    Procedures Procedures (including critical care time)  Medications Ordered in UC Medications - No data to display  Initial Impression / Assessment and  Plan / UC Course  I have reviewed the triage vital signs and the  nursing notes.  Pertinent labs & imaging results that were available during my care of the patient were reviewed by me and considered in my medical decision making (see chart for details).    Dizziness, chest pain.   Pt is a otherwise healthy 33 year old with multiple vague complaints. Work up here in included chest xray, EKG, CBC, CMP, TSH and vitamin.  All labs are back except for the vitamin D, which is most likely normal because she takes vitamin D daily.  All other labs normal.  EKG and chest x ray were normal.  Her exam is otherwise normal and she is non toxic or ill appearing.  VSS There is no concern for CVA, ACS, PE, DVT She has been changing her diet eating less carbs which makes her feel worse, when she eats carbs she feels better.  She has been under some stress at home with taking care of 3 kids, husband working from home, and home schooling. I cannot find a reason for her symptoms medically today.  This could be contributing to her symptoms. Not likely COVID   Will call with her results and recommend monitoring symptoms and if worse go to the ER.  Otherwise today I am not seeing anything life threatening that needs to go to the ER.      Final Clinical Impressions(s) / UC Diagnoses   Final diagnoses:  Dizziness  Chest pain, unspecified type     Discharge Instructions     Your chest x ray was normal.  EKG showed nothing concerning.  Pregnancy test negative.  Your lab work is pending I will call you when I get the results.  Make sure you are eating a healthy diet and staying hydrated. Try to rest and take the meclizine as needed.  If your chest pain or dizziness worsens you will need to go to the ER.     ED Prescriptions    None     Controlled Substance Prescriptions [place] Controlled Substance Registry consulted? Not Applicable   Orvan July, NP 04/21/19 1317

## 2019-04-21 NOTE — Discharge Instructions (Addendum)
Your chest x ray was normal.  EKG showed nothing concerning.  Pregnancy test negative.  Your lab work is pending I will call you when I get the results.  Make sure you are eating a healthy diet and staying hydrated. Try to rest and take the meclizine as needed.  If your chest pain or dizziness worsens you will need to go to the ER.

## 2019-04-21 NOTE — Telephone Encounter (Signed)
Normal labs, Patient contacted and made aware of all results, all questions answered. Will only call her if Vit D lab is abnormal

## 2019-04-21 NOTE — ED Triage Notes (Signed)
Patient presents to Urgent Care with complaints of dizziness ongoing for a month and then fatigue since being prescribed meclizine. Patient reports she then was told to try Claritin by her PCP. Pt now has frequent hot flashes that is relieved by eating and is worried about her blood sugar. Also, pt is concerned with swelling and tenderness over right breast tissue near axilla. Pt's PCP is not currently seeing pts and would like to be seen for a general check up.

## 2019-04-22 LAB — VITAMIN D 25 HYDROXY (VIT D DEFICIENCY, FRACTURES): Vit D, 25-Hydroxy: 70.9 ng/mL (ref 30.0–100.0)

## 2019-04-26 ENCOUNTER — Ambulatory Visit (HOSPITAL_COMMUNITY)
Admission: EM | Admit: 2019-04-26 | Discharge: 2019-04-26 | Disposition: A | Discharge status: Home / Self Care | Payer: 59 | Attending: Family Medicine | Admitting: Family Medicine

## 2019-04-26 ENCOUNTER — Other Ambulatory Visit: Payer: Self-pay

## 2019-04-26 ENCOUNTER — Encounter (HOSPITAL_COMMUNITY): Payer: Self-pay

## 2019-04-26 DIAGNOSIS — R202 Paresthesia of skin: Secondary | ICD-10-CM | POA: Insufficient documentation

## 2019-04-26 LAB — VITAMIN B12: Vitamin B-12: 751 pg/mL (ref 180–914)

## 2019-04-26 MED ORDER — HYDROXYZINE HCL 25 MG PO TABS: 25.0000 mg | ORAL_TABLET | Freq: Four times a day (QID) | ORAL | 0 refills | Status: DC

## 2019-04-26 NOTE — ED Triage Notes (Signed)
Pt states she has been feeling itchy all over this started today.

## 2019-04-26 NOTE — Discharge Instructions (Signed)
May try using hydroxyzine as needed for burning/itching May cause drowsiness, do not drive or work after taking  Follow up with primary care for further evaluation and treatment. Follow up with ENT if dizziness persisting

## 2019-04-27 NOTE — ED Provider Notes (Signed)
[place]    CSN: [id number] Arrival date & time: 04/26/19  1907     History   Chief Complaint Chief Complaint  Patient presents with  . itchy all over    HPI Amanda Shannon is a 33 y.o. female no significant past medical history presenting today for evaluation of dizziness and burning sensation in arms and legs.  Patient notes that beginning 1 month ago she began to have episodes of dizziness.  She was taking meclizine which did help her symptoms.  The dizziness resolved after about 2 weeks, but in the following week she would have episodes of feeling lightheaded and overheated, at the time she was limiting her carb intake, and when she started eating carbs again she states that she felt better.  In the past week she has had recurrent episodes of dizziness.  She was evaluated here 5 days ago with normal EKG, chest x-ray and CBC, CMP, TSH and vitamin D levels which were normal.  Today she is mainly presenting as she is woken up with burning/itching sensation in her arms and legs.  She does most notably feel it in her hands and feet.  She denies any rashes.  Denies any new exposures or new medicines.  She denies any new foods.  Denies history of similar.  Denies any pain.  Denies difficulty moving extremities and fingers, but overall has a fatigue/weakness sensation.  Denies recent URI or fevers.  Has 3 children that she has been working on home schooling with current COVID pandemic.  HPI  Past Medical History:  Diagnosis Date  . Medical history non-contributory     Patient Active Problem List   Diagnosis Date Noted  . SVD (spontaneous vaginal delivery) 05/04/2013  . Postpartum care following vaginal delivery (5/29) 05/04/2013    Past Surgical History:  Procedure Laterality Date  . NO PAST SURGERIES      OB History    Gravida  2   Para  2   Term  1   Preterm      AB      Living  1     SAB      TAB      Ectopic      Multiple      Live Births  1            Home Medications    Prior to Admission medications   Medication Sig Start Date End Date Taking? Authorizing Provider  benzonatate (TESSALON) 100 MG capsule Take 1 capsule (100 mg total) by mouth every 8 (eight) hours. 12/10/18   Tasia Catchings, Liliya V, PA-C  diphenhydrAMINE (BENADRYL) 12.5 MG chewable tablet Chew 25 mg by mouth 4 (four) times daily as needed for allergies (allergies).    [provider]  fluticasone (FLONASE) 50 MCG/ACT nasal spray Place 2 sprays into both nostrils daily. 12/10/18   Tasia Catchings, Lewis V, PA-C  hydrOXYzine (ATARAX/VISTARIL) 25 MG tablet Take 1 tablet (25 mg total) by mouth every 6 (six) hours. 04/26/19   Wieters, Hallie C, PA-C  ipratropium (ATROVENT) 0.06 % nasal spray Place 2 sprays into both nostrils 4 (four) times daily. 12/10/18   Ok Edwards, PA-C  meclizine (ANTIVERT) 25 MG tablet Take 0.5-1 tablets (12.5-25 mg total) by mouth 3 (three) times daily as needed for dizziness. 05/23/18   Jaynee Eagles, PA-C    Family History History reviewed. No pertinent family history.  Social History Social History   Tobacco Use  . Smoking status: Never  Smoker  . Smokeless tobacco: Never Used  Substance Use Topics  . Alcohol use: No  . Drug use: No     Allergies   Amoxicillin   Review of Systems Review of Systems  Constitutional: Negative for fatigue and fever.  HENT: Negative for congestion, sinus pressure and sore throat.   Eyes: Negative for photophobia, pain and visual disturbance.  Respiratory: Negative for cough and shortness of breath.   Cardiovascular: Negative for chest pain.  Gastrointestinal: Negative for abdominal pain, nausea and vomiting.  Genitourinary: Negative for decreased urine volume and hematuria.  Musculoskeletal: Negative for myalgias, neck pain and neck stiffness.  Neurological: Positive for dizziness and numbness. Negative for syncope, facial asymmetry, speech difficulty, weakness, light-headedness and headaches.     Physical Exam  Triage Vital Signs ED Triage Vitals  Enc Vitals Group     BP 04/26/19 1917 126/88     Pulse Rate 04/26/19 1917 97     Resp 04/26/19 1917 16     Temp 04/26/19 1917 99 F (37.2 C)     Temp src --      SpO2 04/26/19 1917 100 %     Weight 04/26/19 1916 135 lb (61.2 kg)     Height --      Head Circumference --      Peak Flow --      Pain Score 04/26/19 2010 0     Pain Loc --      Pain Edu? --      Excl. in [place]? --    No data found.  Updated Vital Signs BP 126/88   Pulse 97   Temp 99 F (37.2 C)   Resp 16   Wt 135 lb (61.2 kg)   SpO2 100%   BMI 20.53 kg/m   Visual Acuity Right Eye Distance:   Left Eye Distance:   Bilateral Distance:    Right Eye Near:   Left Eye Near:    Bilateral Near:     Physical Exam Vitals signs and nursing note reviewed.  Constitutional:      General: She is not in acute distress.    Appearance: She is well-developed.  HENT:     Head: Normocephalic and atraumatic.     Ears:     Comments: Bilateral ears without tenderness to palpation of external auricle, tragus and mastoid, EAC's without erythema or swelling, TM's with good bony landmarks and cone of light. Non erythematous.    Mouth/Throat:     Comments: Oral mucosa pink and moist, no tonsillar enlargement or exudate. Posterior pharynx patent and nonerythematous, no uvula deviation or swelling. Normal phonation. Palate elevating symmetrically Eyes:     Extraocular Movements: Extraocular movements intact.     Conjunctiva/sclera: Conjunctivae normal.     Pupils: Pupils are equal, round, and reactive to light.  Neck:     Musculoskeletal: Neck supple.     Comments: Full active range of motion Cardiovascular:     Rate and Rhythm: Normal rate and regular rhythm.     Heart sounds: No murmur.  Pulmonary:     Effort: Pulmonary effort is normal. No respiratory distress.     Breath sounds: Normal breath sounds.     Comments: Breathing comfortably at rest, CTABL, no wheezing, rales or other  adventitious sounds auscultated Abdominal:     Palpations: Abdomen is soft.     Tenderness: There is no abdominal tenderness.  Musculoskeletal:     Comments: Strength at shoulders, hips and knees 5/5 and equal  bilaterally, grip strength 5/5 and equal bilaterally Patellar reflex 2+ bilaterally  Skin:    General: Skin is warm and dry.  Neurological:     General: No focal deficit present.     Mental Status: She is alert and oriented to person, place, and time. Mental status is at baseline.     Cranial Nerves: No cranial nerve deficit.     Motor: No weakness.     Gait: Gait normal.      UC Treatments / Results  Labs (all labs ordered are listed, but only abnormal results are displayed) Labs Reviewed  VITAMIN B12    EKG None  Radiology No results found.  Procedures Procedures (including critical care time)  Medications Ordered in UC Medications - No data to display  Initial Impression / Assessment and Plan / UC Course  I have reviewed the triage vital signs and the nursing notes.  Pertinent labs & imaging results that were available during my care of the patient were reviewed by me and considered in my medical decision making (see chart for details).     Patient with recent work-up that was negative, exam unremarkable, vital signs stable today.  Do not suspect underlying emergent cause of symptoms.  Discussed with patient unable to provide further testing or imaging at urgent care.  Did mention that occasional B12 vitamin deficiency can cause paresthesias in hands and feet and patient requested to persist with testing for this.  Did end up drawing test.  Recommended to use hydroxyzine as needed to help with itching/burning sensation, follow-up with primary care and/or ENT for persistent symptoms and dizziness.  No rash or known exposure to explain burning sensation.  Continue to monitor,Discussed strict return precautions. Patient verbalized understanding and is agreeable with  plan.  Final Clinical Impressions(s) / UC Diagnoses   Final diagnoses:  Paresthesia     Discharge Instructions     May try using hydroxyzine as needed for burning/itching May cause drowsiness, do not drive or work after taking  Follow up with primary care for further evaluation and treatment. Follow up with ENT if dizziness persisting   ED Prescriptions    Medication Sig Dispense Auth. Provider   hydrOXYzine (ATARAX/VISTARIL) 25 MG tablet Take 1 tablet (25 mg total) by mouth every 6 (six) hours. 20 tablet Wieters, [place] C, PA-C     Controlled Substance Prescriptions [place] Controlled Substance Registry consulted? Not Applicable   Janith Lima, [place] 04/27/19 1223

## 2019-09-12 ENCOUNTER — Encounter (HOSPITAL_COMMUNITY): Payer: Self-pay | Admitting: Emergency Medicine

## 2019-09-12 ENCOUNTER — Ambulatory Visit (HOSPITAL_COMMUNITY)
Admission: EM | Admit: 2019-09-12 | Discharge: 2019-09-12 | Disposition: A | Discharge status: Home / Self Care | Payer: 59 | Attending: Family Medicine | Admitting: Family Medicine

## 2019-09-12 ENCOUNTER — Other Ambulatory Visit: Payer: Self-pay

## 2019-09-12 DIAGNOSIS — Z20828 Contact with and (suspected) exposure to other viral communicable diseases: Secondary | ICD-10-CM | POA: Diagnosis not present

## 2019-09-12 DIAGNOSIS — R631 Polydipsia: Secondary | ICD-10-CM | POA: Diagnosis present

## 2019-09-12 DIAGNOSIS — R195 Other fecal abnormalities: Secondary | ICD-10-CM | POA: Insufficient documentation

## 2019-09-12 DIAGNOSIS — R5383 Other fatigue: Secondary | ICD-10-CM | POA: Diagnosis present

## 2019-09-12 LAB — POCT URINALYSIS DIP (DEVICE)
Bilirubin Urine: NEGATIVE
Glucose, UA: NEGATIVE mg/dL
Ketones, ur: NEGATIVE mg/dL
Leukocytes,Ua: NEGATIVE
Nitrite: NEGATIVE
Protein, ur: NEGATIVE mg/dL
Specific Gravity, Urine: 1.01 (ref 1.005–1.030)
Urobilinogen, UA: 0.2 mg/dL (ref 0.0–1.0)
pH: 7.5 (ref 5.0–8.0)

## 2019-09-12 LAB — HEMOGLOBIN A1C
Hgb A1c MFr Bld: 4.7 % — ABNORMAL LOW (ref 4.8–5.6)
Mean Plasma Glucose: 88.19 mg/dL

## 2019-09-12 NOTE — ED Triage Notes (Signed)
Pt here for multiple complaints; pt sts feels dehydrated although is eating and drinking; pt sts diarrhea today x 3; pt sts some body aches at times; pt is tearful; pt seen here multiple times in past for similar

## 2019-09-12 NOTE — ED Provider Notes (Signed)
Amanda Shannon   [id number] 09/12/19 Arrival Time: [id number]  ASSESSMENT & PLAN:  1. Increased thirst   2. Fatigue, unspecified type   3. Loose stools     Vague symptoms. She has trouble clarifying what is bothering her the most. Question early viral illness. Tolerating PO fluids without difficulty. COVID-19 testing sent. To self-quarantine until results are available. Discussed.  HgA1c pending. Will contact her with any significant results.  Reassured that these symptoms do not appear to represent a serious or threatening condition. Encouraged her to schedule a f/u appt with her PCP.  Reviewed expectations re: course of current medical issues. Questions answered. Outlined signs and symptoms indicating need for more acute intervention. Patient verbalized understanding. Greater than 25 min spent with counseling regarding current symptoms. After Visit Summary given.   SUBJECTIVE:  Amanda Shannon is a 33 y.o. female who reports that she's "just not feeling well". This morning woke with three episodes of non-bloody loose stools and some urgency to defecate. No urinary symptoms. "Feel like I'm dehydrated" but reports drinking several cups of water today without n/v. Afebrile. Without associated abdominal pain. Questions mild L flank discomfort for a minute or two that has now resolved. Ambulatory without difficulty. No known COVID-19 exposures. No associated SOB, CP, or palpatations reported. Some muscle aches.  Also reports "increased thirs over the past month or so". No nocturia. Weight stable. Appetite and PO intake normal. Blood sugars have been normal in the past. Desires HgA1c as she reports FH of DM.  ROS: As per HPI. All other systems negative.    OBJECTIVE:  Vitals:   09/12/19 1252  BP: (!) 130/94  Pulse: 76  Resp: 18  Temp: 98.2 F (36.8 C)  TempSrc: Oral  SpO2: 100%    General appearance: alert; no distress Eyes: PERRLA; EOMI; conjunctiva normal HENT:  normocephalic; atraumatic; TMs normal; nasal mucosa normal; oral mucosa normal; oropharynx moist Neck: supple with FROM Lungs: clear to auscultation bilaterally Heart: regular rate and rhythm Abdomen: soft, non-tender; bowel sounds normal Extremities: no edema; symmetrical with no gross deformities Skin: warm and dry Neurologic: normal gait Psychological: alert and cooperative; normal mood and affect  Investigations: Results for orders placed or performed during the hospital encounter of 09/12/19  POCT urinalysis dip (device)  Result Value Ref Range   Glucose, UA NEGATIVE NEGATIVE mg/dL   Bilirubin Urine NEGATIVE NEGATIVE   Ketones, ur NEGATIVE NEGATIVE mg/dL   Specific Gravity, Urine 1.010 1.005 - 1.030   Hgb urine dipstick SMALL (A) NEGATIVE   pH 7.5 5.0 - 8.0   Protein, ur NEGATIVE NEGATIVE mg/dL   Urobilinogen, UA 0.2 0.0 - 1.0 mg/dL   Nitrite NEGATIVE NEGATIVE   Leukocytes,Ua NEGATIVE NEGATIVE   Labs Reviewed  NOVEL CORONAVIRUS, NAA (HOSP ORDER, SEND-OUT TO REF LAB; TAT 18-24 HRS)  HEMOGLOBIN A1C  POCT URINALYSIS DIP (DEVICE)    Allergies  Allergen Reactions  . Amoxicillin Rash    Past Medical History:  Diagnosis Date  . Medical history non-contributory    Social History   Socioeconomic History  . Marital status: Married    Spouse name: Not on file  . Number of children: Not on file  . Years of education: Not on file  . Highest education level: Not on file  Occupational History  . Not on file  Social Needs  . Financial resource strain: Not on file  . Food insecurity    Worry: Not on file    Inability: Not on file  .  Transportation needs    Medical: Not on file    Non-medical: Not on file  Tobacco Use  . Smoking status: Never Smoker  . Smokeless tobacco: Never Used  Substance and Sexual Activity  . Alcohol use: No  . Drug use: No  . Sexual activity: Yes  Lifestyle  . Physical activity    Days per week: Not on file    Minutes per session: Not on  file  . Stress: Not on file  Relationships  . Social Herbalist on phone: Not on file    Gets together: Not on file    Attends religious service: Not on file    Active member of club or organization: Not on file    Attends meetings of clubs or organizations: Not on file    Relationship status: Not on file  . Intimate partner violence    Fear of current or ex partner: Not on file    Emotionally abused: Not on file    Physically abused: Not on file    Forced sexual activity: Not on file  Other Topics Concern  . Not on file  Social History Narrative  . Not on file   FH: DM  Past Surgical History:  Procedure Laterality Date  . NO PAST SURGERIES        Vanessa Kick, MD 09/12/19 1451

## 2019-09-12 NOTE — Discharge Instructions (Addendum)
If your Covid-19 test is positive, you will get a phone call from [facility] regarding your results. If your Covid-19 test is negative, you will NOT get a phone call from [facility] with your results. You may view your results on MyChart. If you do not have a MyChart account, sign up instructions are in your discharge papers. ° °

## 2019-09-13 LAB — NOVEL CORONAVIRUS, NAA (HOSP ORDER, SEND-OUT TO REF LAB; TAT 18-24 HRS): SARS-CoV-2, NAA: NOT DETECTED

## 2020-06-22 IMAGING — DX CHEST - 2 VIEW
2 series · 2 of 2 positions shown · non-contrast
Comparison: None.

CLINICAL DATA: 32-year-old female with chest pain, chest tightness
on the right side for 1 week.

EXAM:
CHEST - 2 VIEW

[chest pa]
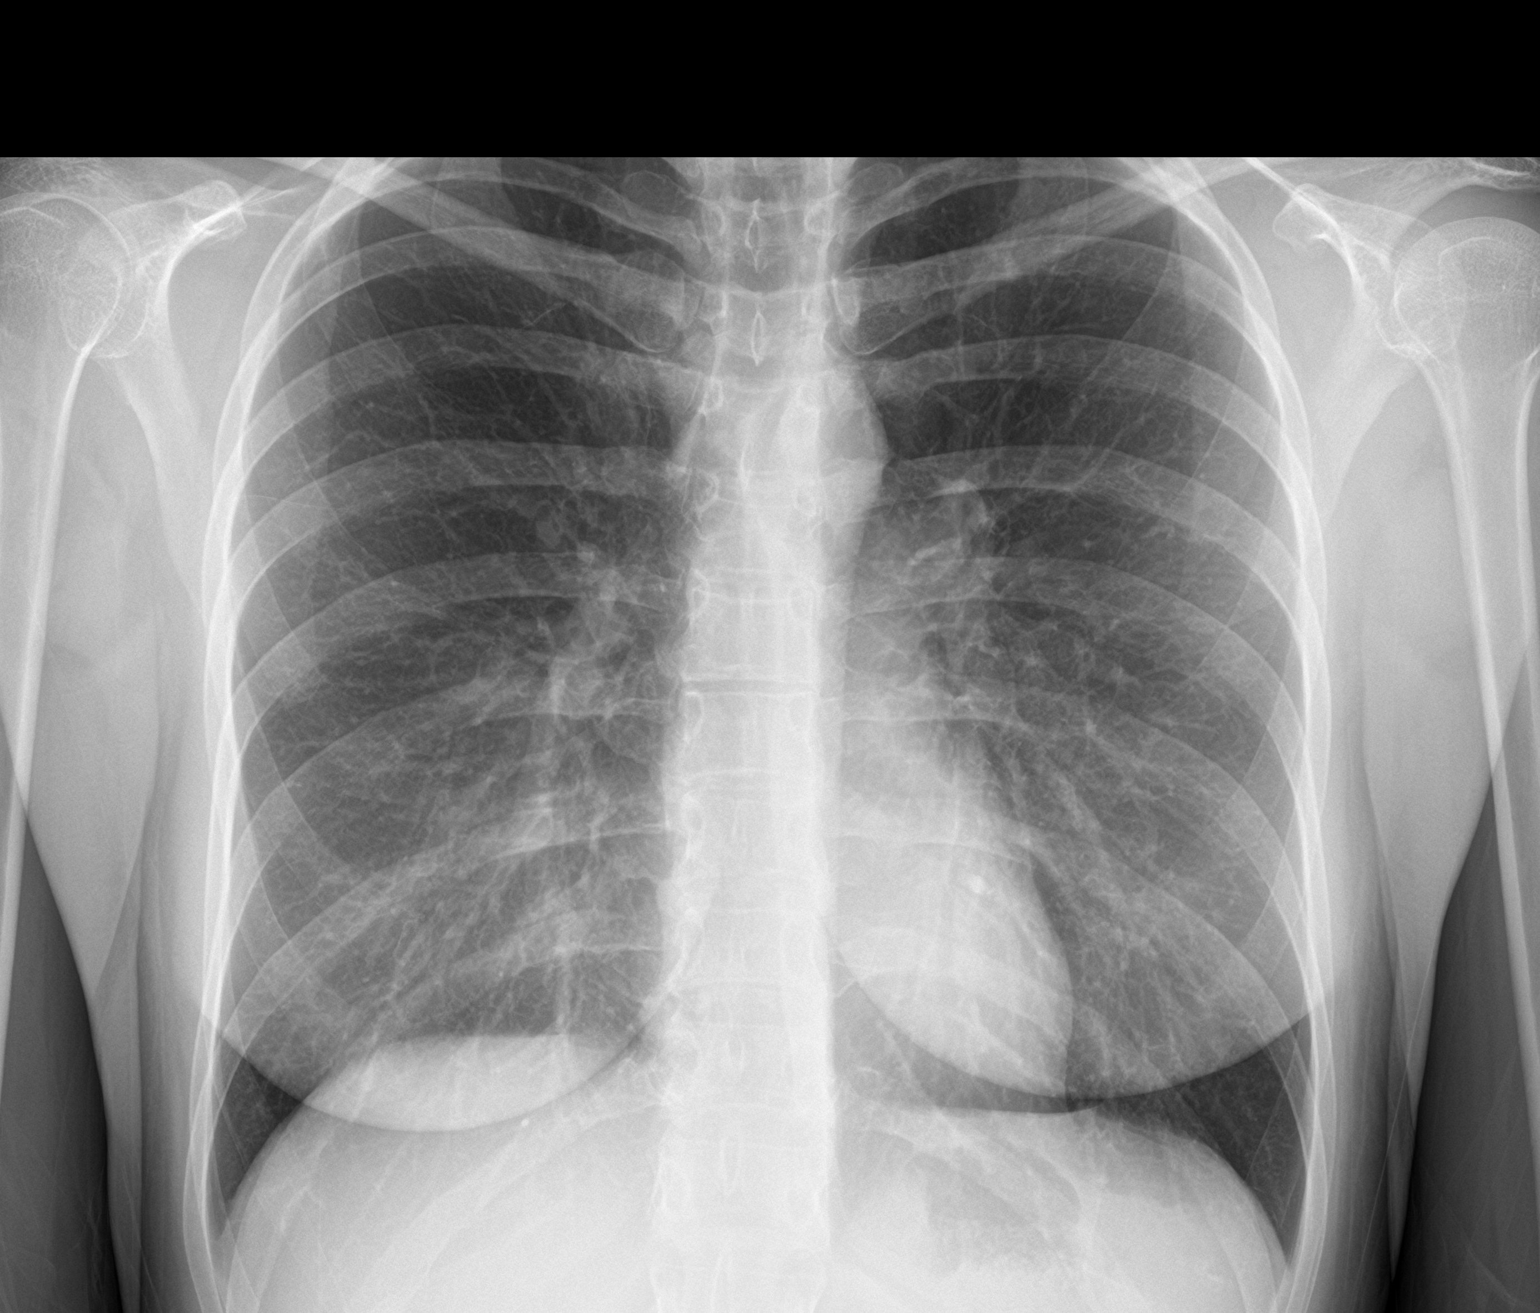

[chest lat]
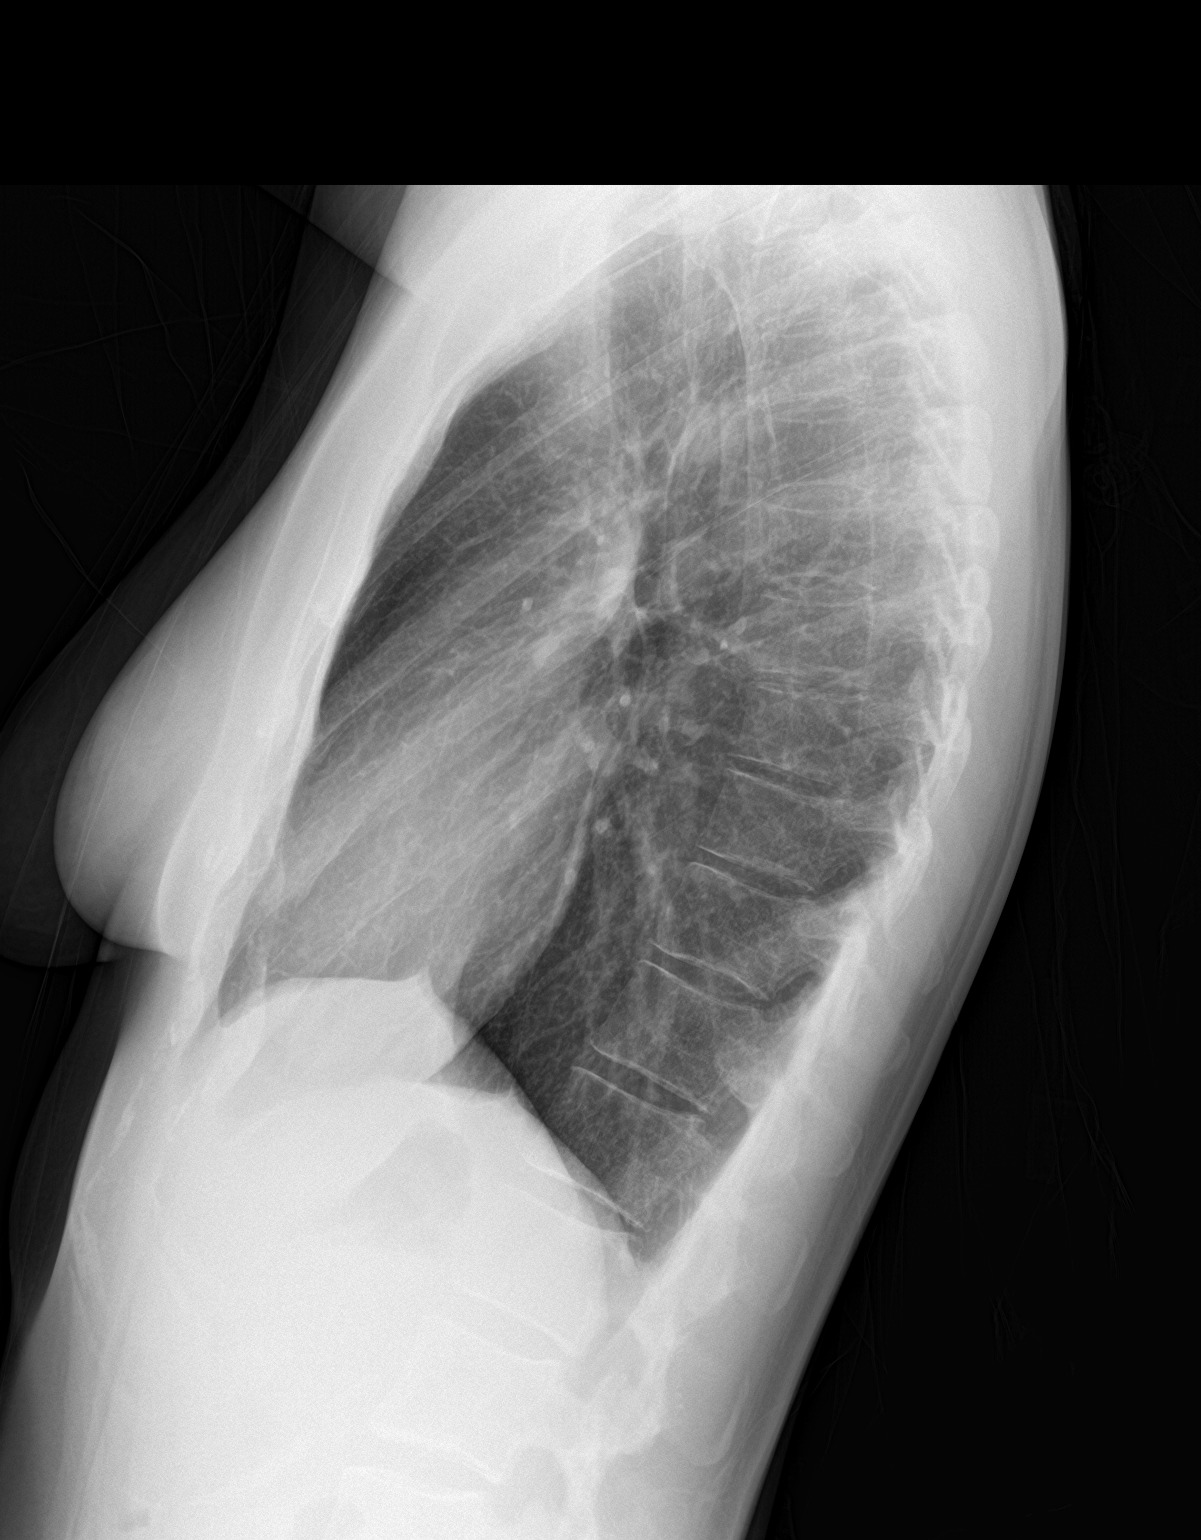

[2 of 2 positions shown; findings below may reference images not displayed]

FINDINGS: Normal lung volumes and mediastinal contours. Visualized tracheal
air column is within normal limits. Both lungs appear clear. No
pneumothorax or pleural effusion. Negative visible bowel gas pattern
and osseous structures.
IMPRESSION: Negative.  No cardiopulmonary abnormality.

## 2020-09-26 ENCOUNTER — Encounter (HOSPITAL_COMMUNITY): Payer: Self-pay

## 2020-09-26 ENCOUNTER — Ambulatory Visit (INDEPENDENT_AMBULATORY_CARE_PROVIDER_SITE_OTHER): Payer: 59

## 2020-09-26 ENCOUNTER — Other Ambulatory Visit: Payer: Self-pay

## 2020-09-26 ENCOUNTER — Ambulatory Visit (HOSPITAL_COMMUNITY)
Admission: EM | Admit: 2020-09-26 | Discharge: 2020-09-26 | Disposition: A | Discharge status: Home / Self Care | Payer: 59 | Attending: Family Medicine | Admitting: Family Medicine

## 2020-09-26 DIAGNOSIS — R0602 Shortness of breath: Secondary | ICD-10-CM | POA: Insufficient documentation

## 2020-09-26 DIAGNOSIS — Z20822 Contact with and (suspected) exposure to covid-19: Secondary | ICD-10-CM | POA: Diagnosis not present

## 2020-09-26 DIAGNOSIS — R52 Pain, unspecified: Secondary | ICD-10-CM | POA: Diagnosis present

## 2020-09-26 HISTORY — DX: Candidiasis, unspecified: B37.9

## 2020-09-26 LAB — CBC WITH DIFFERENTIAL/PLATELET
Abs Immature Granulocytes: 0.04 10*3/uL (ref 0.00–0.07)
Basophils Absolute: 0 10*3/uL (ref 0.0–0.1)
Basophils Relative: 0 %
Eosinophils Absolute: 0.1 10*3/uL (ref 0.0–0.5)
Eosinophils Relative: 1 %
HCT: 46.6 % — ABNORMAL HIGH (ref 36.0–46.0)
Hemoglobin: 16 g/dL — ABNORMAL HIGH (ref 12.0–15.0)
Immature Granulocytes: 0 %
Lymphocytes Relative: 11 %
Lymphs Abs: 1 10*3/uL (ref 0.7–4.0)
MCH: 30 pg (ref 26.0–34.0)
MCHC: 34.3 g/dL (ref 30.0–36.0)
MCV: 87.3 fL (ref 80.0–100.0)
Monocytes Absolute: 0.4 10*3/uL (ref 0.1–1.0)
Monocytes Relative: 5 %
Neutro Abs: 7.9 10*3/uL — ABNORMAL HIGH (ref 1.7–7.7)
Neutrophils Relative %: 83 %
Platelets: 197 10*3/uL (ref 150–400)
RBC: 5.34 MIL/uL — ABNORMAL HIGH (ref 3.87–5.11)
RDW: 11.9 % (ref 11.5–15.5)
WBC: 9.5 10*3/uL (ref 4.0–10.5)
nRBC: 0 % (ref 0.0–0.2)

## 2020-09-26 LAB — SARS CORONAVIRUS 2 (TAT 6-24 HRS): SARS Coronavirus 2: NEGATIVE

## 2020-09-26 LAB — COMPREHENSIVE METABOLIC PANEL
ALT: 19 U/L (ref 0–44)
AST: 20 U/L (ref 15–41)
Albumin: 4.5 g/dL (ref 3.5–5.0)
Alkaline Phosphatase: 46 U/L (ref 38–126)
Anion gap: 13 (ref 5–15)
BUN: 10 mg/dL (ref 6–20)
CO2: 23 mmol/L (ref 22–32)
Calcium: 9.7 mg/dL (ref 8.9–10.3)
Chloride: 106 mmol/L (ref 98–111)
Creatinine, Ser: 0.87 mg/dL (ref 0.44–1.00)
GFR, Estimated: >60 mL/min (ref >60)
Glucose, Bld: 104 mg/dL — ABNORMAL HIGH (ref 70–99)
Potassium: 3.9 mmol/L (ref 3.5–5.1)
Sodium: 142 mmol/L (ref 135–145)
Total Bilirubin: 1 mg/dL (ref 0.3–1.2)
Total Protein: 7.6 g/dL (ref 6.5–8.1)

## 2020-09-26 LAB — MAGNESIUM: Magnesium: 1.9 mg/dL (ref 1.7–2.4)

## 2020-09-26 NOTE — ED Triage Notes (Signed)
Pt presents with generalized body aches and shortness of breath over the past few weeks with today being the worse.

## 2020-09-26 NOTE — Discharge Instructions (Addendum)
Your x ray, EKG were not concerning today Blood work normal I would recommend seeing primary care and possible referral to cardiology or pulmonology.  I have referred you to pulmonology. I believe that would be a good start.  You need to find a new primary care doctor.

## 2020-09-27 NOTE — ED Provider Notes (Signed)
[facility]    CSN: [id number] Arrival date & time: 09/26/20  1148      History   Chief Complaint Chief Complaint  Patient presents with   Generalized Body Aches   Shortness of Breath    HPI Amanda Shannon is a 34 y.o. female.   Patient is a 34 year old female with no significant past medical history.  She presents today with increase shortness of breath, generalized body aches worsening over the past few weeks.  Patient has had this issue off and on for the past year.  Has been seen multiple times for similar complaints.  Is seeing a functional doctor which diagnosed her with candidiasis and has been treated for that twice.  No fever, chills, cough, chest congestion.  Reporting some increased heart rate.  No chest pain.  Kids recently sick with viral symptoms.  Takes magnesium supplements to help with her joint aches and body pains.  Recently took this medication and reports she became very fatigued.     Past Medical History:  Diagnosis Date   Candidiasis    Medical history non-contributory     Patient Active Problem List   Diagnosis Date Noted   SVD (spontaneous vaginal delivery) 05/04/2013   Postpartum care following vaginal delivery (5/29) 05/04/2013    Past Surgical History:  Procedure Laterality Date   NO PAST SURGERIES      OB History    Gravida  2   Para  2   Term  1   Preterm      AB      Living  1     SAB      TAB      Ectopic      Multiple      Live Births  1            Home Medications    Prior to Admission medications   Medication Sig Start Date End Date Taking? Authorizing Provider  benzonatate (TESSALON) 100 MG capsule Take 1 capsule (100 mg total) by mouth every 8 (eight) hours. Patient not taking: Reported on 09/12/2019 12/10/18   Ok Edwards, PA-C  diphenhydrAMINE (BENADRYL) 12.5 MG chewable tablet Chew 25 mg by mouth 4 (four) times daily as needed for allergies (allergies).    [provider]    fluticasone (FLONASE) 50 MCG/ACT nasal spray Place 2 sprays into both nostrils daily. 12/10/18   Tasia Catchings, Shantinique V, PA-C  hydrOXYzine (ATARAX/VISTARIL) 25 MG tablet Take 1 tablet (25 mg total) by mouth every 6 (six) hours. 04/26/19   Wieters, Hallie C, PA-C  ipratropium (ATROVENT) 0.06 % nasal spray Place 2 sprays into both nostrils 4 (four) times daily. 12/10/18   Ok Edwards, PA-C  meclizine (ANTIVERT) 25 MG tablet Take 0.5-1 tablets (12.5-25 mg total) by mouth 3 (three) times daily as needed for dizziness. 05/23/18   Jaynee Eagles, PA-C    Family History Family History  Family history unknown: Yes    Social History Social History   Tobacco Use   Smoking status: Never Smoker   Smokeless tobacco: Never Used  Scientific laboratory technician Use: Never used  Substance Use Topics   Alcohol use: No   Drug use: No     Allergies   Amoxicillin   Review of Systems Review of Systems   Physical Exam Triage Vital Signs ED Triage Vitals  Enc Vitals Group     BP 09/26/20 1203 119/80     Pulse Rate 09/26/20 1203 (!)  107     Resp 09/26/20 1203 17     Temp 09/26/20 1203 98.1 F (36.7 C)     Temp Source 09/26/20 1203 Oral     SpO2 09/26/20 1203 100 %     Weight --      Height --      Head Circumference --      Peak Flow --      Pain Score 09/26/20 1201 6     Pain Loc --      Pain Edu? --      Excl. in [place]? --    No data found.  Updated Vital Signs BP 119/80 (BP Location: Right Arm)    Pulse (!) 107    Temp 98.1 F (36.7 C) (Oral)    Resp 17    LMP 09/23/2020    SpO2 100%   Visual Acuity Right Eye Distance:   Left Eye Distance:   Bilateral Distance:    Right Eye Near:   Left Eye Near:    Bilateral Near:     Physical Exam Vitals and nursing note reviewed.  Constitutional:      General: She is not in acute distress.    Appearance: Normal appearance. She is not ill-appearing, toxic-appearing or diaphoretic.  HENT:     Head: Normocephalic.     Nose: Nose normal.  Eyes:      Conjunctiva/sclera: Conjunctivae normal.  Cardiovascular:     Rate and Rhythm: Normal rate and regular rhythm.  Pulmonary:     Effort: Pulmonary effort is normal.     Breath sounds: Normal breath sounds.  Musculoskeletal:        General: Normal range of motion.     Cervical back: Normal range of motion.  Skin:    General: Skin is warm and dry.     Findings: No rash.  Neurological:     Mental Status: She is alert.  Psychiatric:        Mood and Affect: Mood normal.      UC Treatments / Results  Labs (all labs ordered are listed, but only abnormal results are displayed) Labs Reviewed  CBC WITH DIFFERENTIAL/PLATELET - Abnormal; Notable for the following components:      Result Value   RBC 5.34 (*)    Hemoglobin 16.0 (*)    HCT 46.6 (*)    Neutro Abs 7.9 (*)    All other components within normal limits  COMPREHENSIVE METABOLIC PANEL - Abnormal; Notable for the following components:   Glucose, Bld 104 (*)    All other components within normal limits  SARS CORONAVIRUS 2 (TAT 6-24 HRS)  MAGNESIUM    EKG   Radiology DG Chest 2 View  Result Date: 09/26/2020 CLINICAL DATA:  Shortness of breath. EXAM: CHEST - 2 VIEW COMPARISON:  04/21/2019 FINDINGS: The cardiomediastinal silhouette is within normal limits. The lungs are well inflated and clear. There is no evidence of pleural effusion or pneumothorax. No acute osseous abnormality is identified. IMPRESSION: No active cardiopulmonary disease. Electronically Signed   By: Logan Bores M.D.   On: 09/26/2020 13:49    Procedures Procedures (including critical care time)  Medications Ordered in UC Medications - No data to display  Initial Impression / Assessment and Plan / UC Course  I have reviewed the triage vital signs and the nursing notes.  Pertinent labs & imaging results that were available during my care of the patient were reviewed by me and considered in my medical decision making (see  chart for details).     Body  aches and shortness of breath Chest x-ray normal today. Blood work unremarkable besides elevated hemoglobin and hematocrit which is chronic. EKG with sinus arrhythmia and occasional PVC with biatrial enlargement and rightward axis.  Similar to previous EKG.  No concern for ACS at this time. Heart rate normalized upon assessment in the 80s. Denies being anxious or stressed.   I have put referral in for pulmonology for possible underlying chronic lung issue that is causing her symptoms.  Recommended also see cardiology. Needs to establish with primary care  Final Clinical Impressions(s) / UC Diagnoses   Final diagnoses:  None     Discharge Instructions     Your x ray, EKG were not concerning today Blood work normal I would recommend seeing primary care and possible referral to cardiology or pulmonology.  I have referred you to pulmonology. I believe that would be a good start.  You need to find a new primary care doctor.      ED Prescriptions    None     PDMP not reviewed this encounter.   Orvan July, NP 09/27/20 [phone]

## 2021-11-28 IMAGING — DX DG CHEST 2V
2 series · 2 of 2 positions shown · non-contrast
Comparison: 04/21/2019

CLINICAL DATA: Shortness of breath.

EXAM:
CHEST - 2 VIEW

[chest pa]
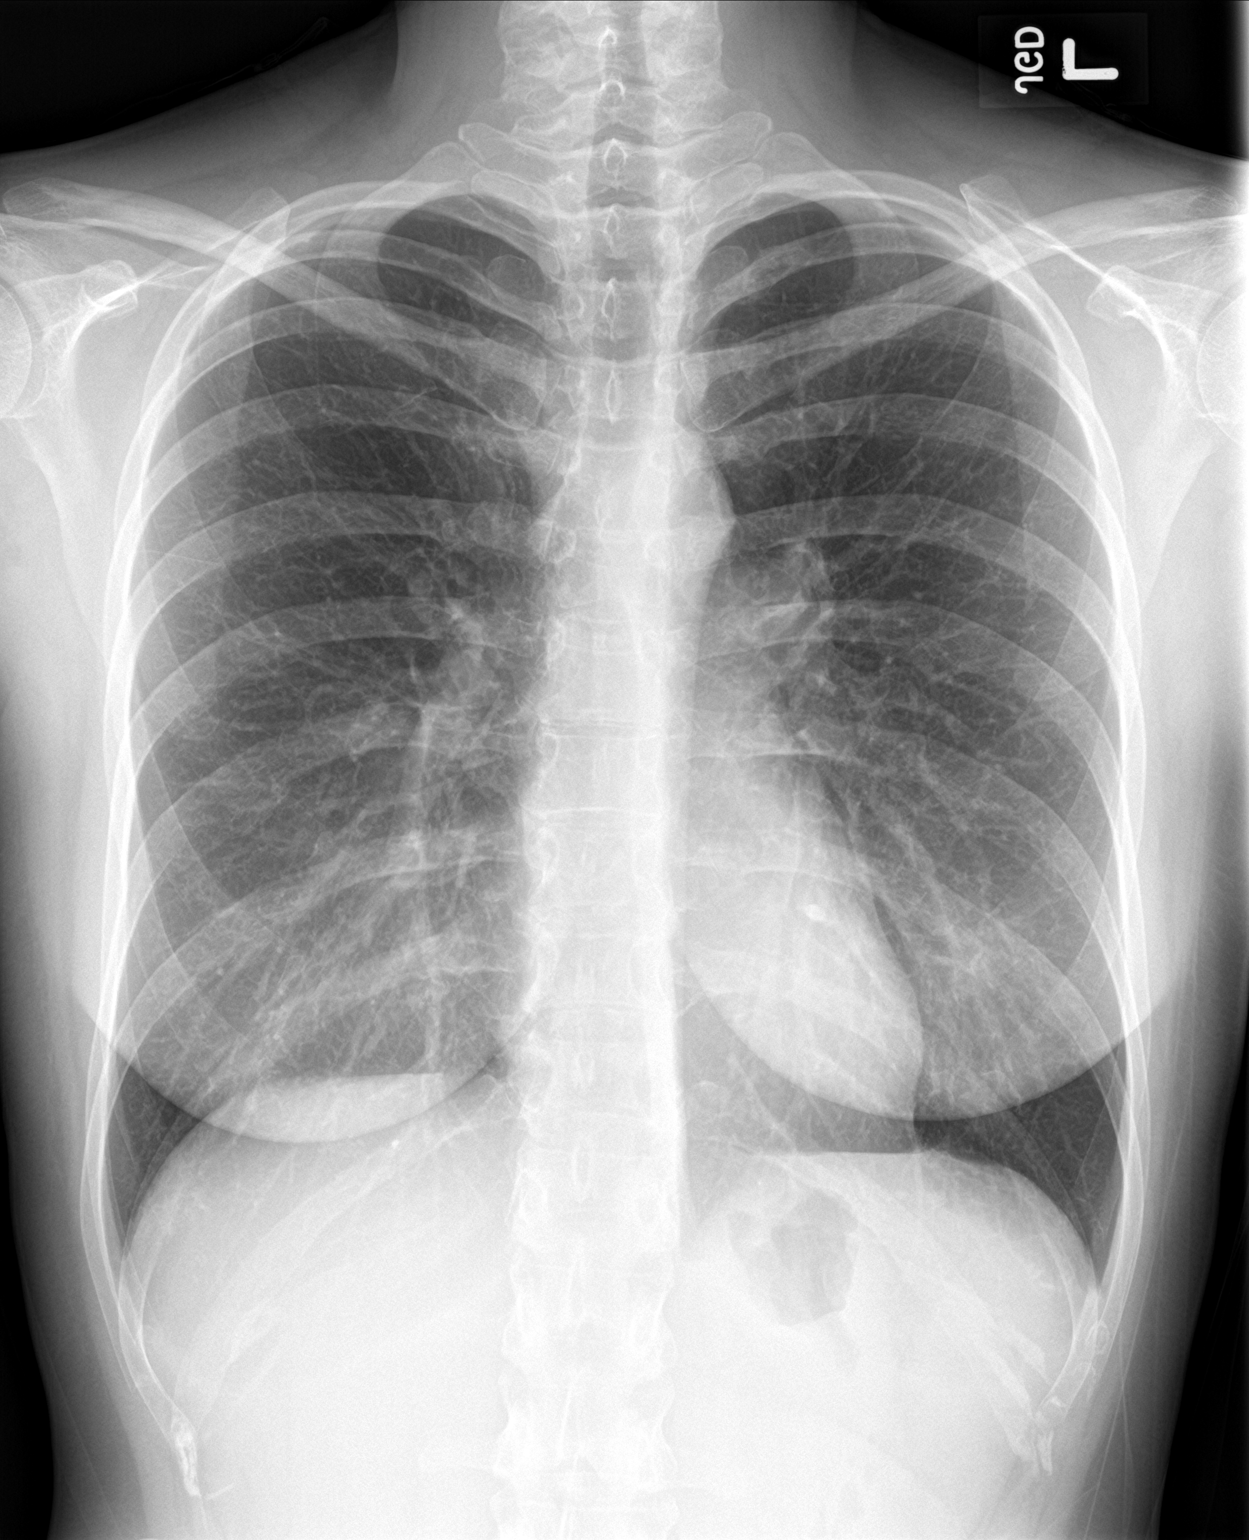

[chest lat]
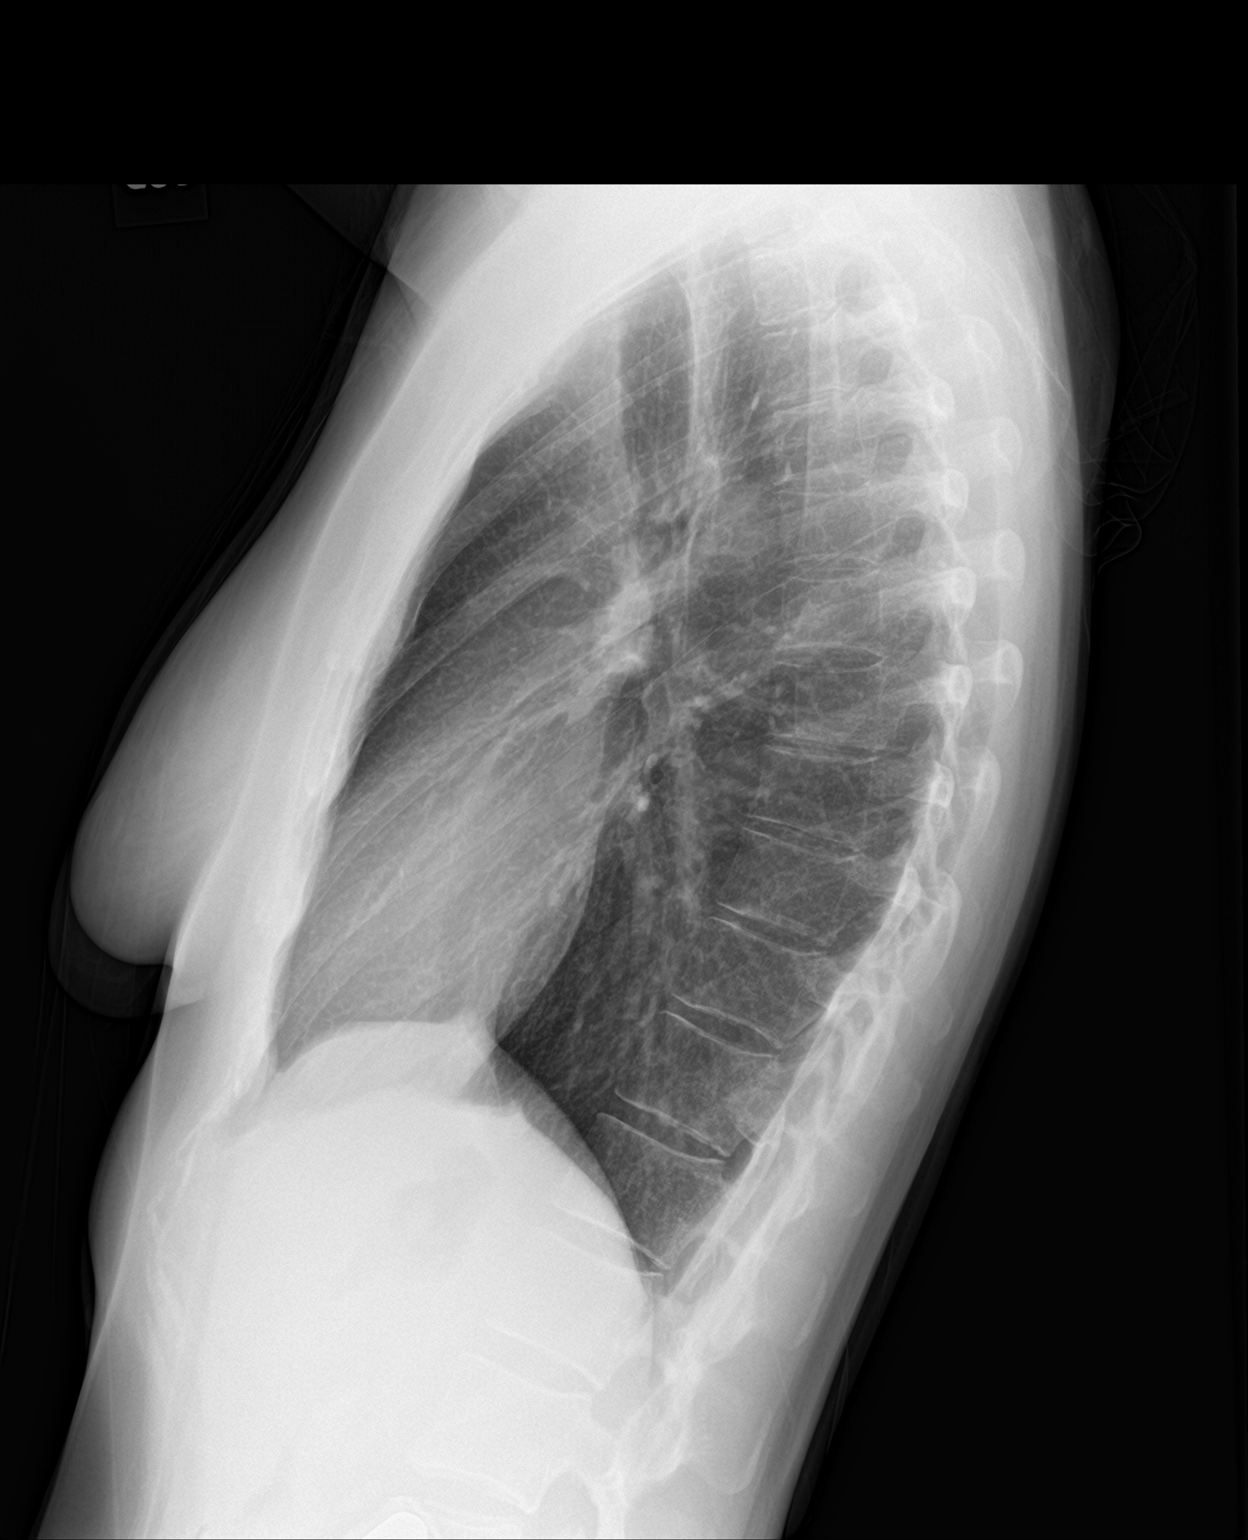

[2 of 2 positions shown; findings below may reference images not displayed]

FINDINGS: The cardiomediastinal silhouette is within normal limits. The lungs
are well inflated and clear. There is no evidence of pleural
effusion or pneumothorax. No acute osseous abnormality is
identified.
IMPRESSION: No active cardiopulmonary disease.

## 2022-12-23 NOTE — Progress Notes (Signed)
Cardiology Office Note:    Date:  12/24/2022   ID:  JASHLEY CRONKRIGHT, DOB November 29, 1986, MRN [id number]  PCP:  Doyle Askew, PA-C  Cardiologist:  Buford Dresser, MD  Referring MD: Barrie Lyme   CC:  New patient evaluation for heart murmur  History of Present Illness:    Amanda Shannon is a 37 y.o. female with a hx of heart murmur, who is seen as a new consult at the request of Barrie Lyme for the evaluation and management of heart murmur.  Referral notes from Lorenza Evangelist, PA-C personally reviewed. At their appointment on 11/05/2022 she was noted to have a personal history of heart murmur. She also reported a family history of RBBB and hypertension in her father. She was referred to cardiology for further evaluation and management.  Cardiovascular risk factors: Prior clinical ASCVD: None. Comorbid conditions: None. Metabolic syndrome/Obesity: Highest adult weight about 170 lbs during pregnancy. Currently 151 lbs in clinic. Chronic inflammatory conditions: None. Tobacco use history: None Family history:  Her father has a RBBB, hypertension, spherocytosis. Her mother has diabetes. She has 3 older brothers, one of whom with spherocytosis. No known sudden early death, heart attacks, or strokes in her family. Prior cardiac testing and/or incidental findings on other testing (ie coronary calcium): Exercise level: Strenuous activities include going to the gym. She tried the elliptical and lifting some weights. No routine formal exercise. No racing heart rates or palpitations other than during intense exercise which she would consider normal. In the past she blacked out while playing tennis outdoors. Also had a few remote presyncopal episodes. May not have had breakfast the one day. She was able to sit down and recover eventually.   Current diet: Trying to eat somewhat healthy. No alcohol consumption.  She denies any palpitations, chest pain, shortness of breath,  or peripheral edema. No lightheadedness, headaches, orthopnea, or PND.  Past Medical History:  Diagnosis Date   Candidiasis    Medical history non-contributory     Past Surgical History:  Procedure Laterality Date   NO PAST SURGERIES      Current Medications: Current Outpatient Medications on File Prior to Visit  Medication Sig   Cholecalciferol (VITAMIN D) 125 MCG (5000 UT) CAPS Take 1 capsule by mouth daily.   Probiotic Product (PROBIOTIC DAILY PO) Take 1 capsule by mouth daily.   No current facility-administered medications on file prior to visit.     Allergies:   Amoxicillin   Social History   Tobacco Use   Smoking status: Never   Smokeless tobacco: Never  Vaping Use   Vaping Use: Never used  Substance Use Topics   Alcohol use: No   Drug use: No    Family History: family history includes Heart disease in her father; Hyperlipidemia in her father.  ROS:   Please see the history of present illness.  Additional pertinent ROS: Constitutional: Negative for chills, fever, night sweats, unintentional weight loss  HENT: Negative for ear pain and hearing loss.   Eyes: Negative for loss of vision and eye pain.  Respiratory: Negative for cough, sputum, wheezing.   Cardiovascular: See HPI. Gastrointestinal: Negative for abdominal pain, melena, and hematochezia.  Genitourinary: Negative for dysuria and hematuria.  Musculoskeletal: Negative for falls and myalgias.  Skin: Negative for itching and rash.  Neurological: Negative for focal weakness, focal sensory changes and loss of consciousness.  Endo/Heme/Allergies: Does not bruise/bleed easily.     EKGs/Labs/Other Studies Reviewed:    The  following studies were reviewed today:  Chest X-ray  09/26/2020: FINDINGS: The cardiomediastinal silhouette is within normal limits. The lungs are well inflated and clear. There is no evidence of pleural effusion or pneumothorax. No acute osseous abnormality is identified.    IMPRESSION: No active cardiopulmonary disease.  EKG:  EKG is personally reviewed.   12/24/2022:  sinus rhythm with sinus arrhythmia at 83 bpm  Recent Labs: No results found for requested labs within last 365 days.   Recent Lipid Panel No results found for: "CHOL", "TRIG", "HDL", "CHOLHDL", "VLDL", "LDLCALC", "LDLDIRECT"  Physical Exam:    VS:  BP 110/82 (BP Location: Left Arm, Patient Position: Sitting, Cuff Size: Normal)   Pulse 83   Ht 5' 8"$  (1.727 m)   Wt 151 lb 3.2 oz (68.6 kg)   BMI 22.99 kg/m     Wt Readings from Last 3 Encounters:  12/24/22 151 lb 3.2 oz (68.6 kg)  04/26/19 135 lb (61.2 kg)  05/04/13 170 lb (77.1 kg)    GEN: Well nourished, well developed in no acute distress HEENT: Normal, moist mucous membranes NECK: No JVD CARDIAC: regular rhythm, normal S1 and S2, no rubs or gallops. No murmur. VASCULAR: Radial and DP pulses 2+ bilaterally. No carotid bruits RESPIRATORY:  Clear to auscultation without rales, wheezing or rhonchi  ABDOMEN: Soft, non-tender, non-distended MUSCULOSKELETAL:  Ambulates independently SKIN: Warm and dry, no edema NEUROLOGIC:  Alert and oriented x 3. No focal neuro deficits noted. PSYCHIATRIC:  Normal affect    ASSESSMENT:    1. Murmur, cardiac   2. Family history of heart disease   3. Cardiac risk counseling   4. Counseling on health promotion and disease prevention    PLAN:    History of heart murmur -not appreciated on exam today -no high risk symptoms -no indication for further evaluation at this time  Family history of heart disease -reported history of RBBB in family (which does not carry additional CV risk) -ECG normal today  Cardiac risk counseling and prevention recommendations: -recommend heart healthy/Mediterranean diet, with whole grains, fruits, vegetable, fish, lean meats, nuts, and olive oil. Limit salt. -recommend moderate walking, 3-5 times/week for 30-50 minutes each session. Aim for at least 150  minutes.week. Goal should be pace of 3 miles/hours, or walking 1.5 miles in 30 minutes -recommend avoidance of tobacco products. Avoid excess alcohol. -ASCVD risk score: The ASCVD Risk score (Arnett DK, et al., 2019) failed to calculate for the following reasons:   The 2019 ASCVD risk score is only valid for ages 35 to 28    Plan for follow up: As needed.  Buford Dresser, MD, PhD, [facility]    Medication Adjustments/Labs and Tests Ordered: Current medicines are reviewed at length with the patient today.  Concerns regarding medicines are outlined above.   Orders Placed This Encounter  Procedures   EKG 12-Lead   No orders of the defined types were placed in this encounter.  Patient Instructions  Medication Instructions:  Your physician recommends that you continue on your current medications as directed. Please refer to the Current Medication list given to you today.   Labwork: NONE  Testing/Procedures: NONE  Follow-Up: AS NEEDED       I,Mathew Stumpf,acting as a Education administrator for [employer], MD.,have documented all relevant documentation on the behalf of Buford Dresser, MD,as directed by  Buford Dresser, MD while in the presence of Buford Dresser, MD.  I, Buford Dresser, MD, have reviewed all documentation for this visit.  The documentation on 01/24/23 for the exam, diagnosis, procedures, and orders are all accurate and complete.   Signed, Buford Dresser, MD PhD 12/24/2022     [place]

## 2022-12-24 ENCOUNTER — Ambulatory Visit (INDEPENDENT_AMBULATORY_CARE_PROVIDER_SITE_OTHER): Payer: 59 | Admitting: Cardiology

## 2022-12-24 ENCOUNTER — Encounter (HOSPITAL_BASED_OUTPATIENT_CLINIC_OR_DEPARTMENT_OTHER): Payer: Self-pay | Admitting: Cardiology

## 2022-12-24 VITALS — BP 110/82 | HR 83 | Ht 68.0 in | Wt 151.2 lb

## 2022-12-24 DIAGNOSIS — R011 Cardiac murmur, unspecified: Secondary | ICD-10-CM | POA: Diagnosis not present

## 2022-12-24 DIAGNOSIS — Z7189 Other specified counseling: Secondary | ICD-10-CM

## 2022-12-24 DIAGNOSIS — Z8249 Family history of ischemic heart disease and other diseases of the circulatory system: Secondary | ICD-10-CM | POA: Diagnosis not present

## 2022-12-24 NOTE — Patient Instructions (Signed)
Medication Instructions:  ?Your physician recommends that you continue on your current medications as directed. Please refer to the Current Medication list given to you today.  ? ?Labwork: ?NONE ? ?Testing/Procedures: ?NONE ? ?Follow-Up: ?AS NEEDED  ? ?  ?

## 2023-04-20 ENCOUNTER — Ambulatory Visit
Admission: RE | Admit: 2023-04-20 | Discharge: 2023-04-20 | Disposition: A | Discharge status: Home / Self Care | Payer: 59 | Source: Ambulatory Visit | Attending: Orthopedic Surgery | Admitting: Orthopedic Surgery

## 2023-04-20 ENCOUNTER — Other Ambulatory Visit: Payer: Self-pay | Admitting: Orthopedic Surgery

## 2023-04-20 DIAGNOSIS — S42452A Displaced fracture of lateral condyle of left humerus, initial encounter for closed fracture: Secondary | ICD-10-CM

## 2023-04-20 DIAGNOSIS — S42453A Displaced fracture of lateral condyle of unspecified humerus, initial encounter for closed fracture: Secondary | ICD-10-CM

## 2023-04-20 DIAGNOSIS — M25522 Pain in left elbow: Secondary | ICD-10-CM | POA: Insufficient documentation

## 2023-04-20 HISTORY — DX: Displaced fracture of lateral condyle of unspecified humerus, initial encounter for closed fracture: S42.453A

## 2023-04-23 ENCOUNTER — Encounter (HOSPITAL_BASED_OUTPATIENT_CLINIC_OR_DEPARTMENT_OTHER): Payer: Self-pay | Admitting: Orthopedic Surgery

## 2023-04-23 ENCOUNTER — Other Ambulatory Visit: Payer: Self-pay

## 2023-04-23 NOTE — Progress Notes (Signed)
   04/23/23 1534  PAT Phone Screen  Is the patient taking a GLP-1 receptor agonist? No  Do You Have Diabetes? No  Do You Have Hypertension? No  Have You Ever Been to the ER for Asthma? No  Have You Taken Oral Steroids in the Past 3 Months? No  Do you Take Phenteramine or any Other Diet Drugs? No  Recent  Lab Work, EKG, CXR? Yes  Where was this test performed? 12/24/2022 EKG [facility]  Do you have a history of heart problems? No  Cardiologist Name Consult for pcp concerns of a possible murmur. OV w/ Dr Cristal Deer- no murmur found.12/24/22  Any Recent Hospitalizations? No  Height 5\' 8"  (1.727 m)  Weight 65.8 kg  Pat Appointment Scheduled No  Reason for No Appointment Not Needed

## 2023-04-28 ENCOUNTER — Ambulatory Visit (HOSPITAL_BASED_OUTPATIENT_CLINIC_OR_DEPARTMENT_OTHER): Admission: RE | Admit: 2023-04-28 | Payer: 59 | Source: Home / Self Care | Admitting: Orthopedic Surgery

## 2023-04-28 DIAGNOSIS — Z01818 Encounter for other preprocedural examination: Secondary | ICD-10-CM

## 2023-04-28 SURGERY — OPEN REDUCTION INTERNAL FIXATION (ORIF) ELBOW/OLECRANON FRACTURE
Anesthesia: Regional | Site: Elbow | Laterality: Left

## 2023-05-20 DIAGNOSIS — M79641 Pain in right hand: Secondary | ICD-10-CM | POA: Insufficient documentation

## 2023-07-22 DIAGNOSIS — M25622 Stiffness of left elbow, not elsewhere classified: Secondary | ICD-10-CM | POA: Insufficient documentation

## 2024-08-17 ENCOUNTER — Ambulatory Visit (INDEPENDENT_AMBULATORY_CARE_PROVIDER_SITE_OTHER): Admitting: Podiatry

## 2024-08-17 ENCOUNTER — Encounter: Payer: Self-pay | Admitting: Podiatry

## 2024-08-17 DIAGNOSIS — L603 Nail dystrophy: Secondary | ICD-10-CM

## 2024-08-17 NOTE — Progress Notes (Signed)
  Subjective:  Patient ID: Amanda Shannon, female    DOB: 12/28/1985,  MRN: [id number] HPI Chief Complaint  Patient presents with   Nail Problem    Toenails bilateral - a few nails on each foot is thick, discolored, has a ridge through them, no recent injury, no treatment, sometimes tender   New Patient (Initial Visit)    37 y.o. female presents with the above complaint.   ROS: Denies fever chills nausea mobic muscle aches pains calf pain back pain chest pain shortness of breath.  Relates trauma in college in high school with soccer.  Past Medical History:  Diagnosis Date   Candidiasis    Closed fracture of capitellum of distal humerus 04/20/2023   Pickleball injury   Medical history non-contributory    Past Surgical History:  Procedure Laterality Date   NO PAST SURGERIES      Current Outpatient Medications:    Cholecalciferol (VITAMIN D ) 125 MCG (5000 UT) CAPS, Take 1 capsule by mouth daily., Disp: , Rfl:    ibuprofen  (ADVIL ) 400 MG tablet, Take 400 mg by mouth every 6 (six) hours as needed for mild pain., Disp: , Rfl:    Probiotic Product (PROBIOTIC DAILY PO), Take 1 capsule by mouth daily., Disp: , Rfl:   Allergies  Allergen Reactions   Amoxicillin Rash   Review of Systems Objective:  There were no vitals filed for this visit.  General: Well developed, nourished, in no acute distress, alert and oriented x3   Dermatological: Skin is warm, dry and supple bilateral. Nails x 10 are well maintained; remaining integument appears unremarkable at this time. There are no open sores, no preulcerative lesions, no rash or signs of infection present.  Second nail bilaterally and hallux demonstrates some thickening with transverse lines which appears to be dystrophic  Vascular: Dorsalis Pedis artery and Posterior Tibial artery pedal pulses are 2/4 bilateral with immedate capillary fill time. Pedal hair growth present. No varicosities and no lower extremity edema present bilateral.    Neruologic: Grossly intact via light touch bilateral. Vibratory intact via tuning fork bilateral. Protective threshold with Semmes Wienstein monofilament intact to all pedal sites bilateral. Patellar and Achilles deep tendon reflexes 2+ bilateral. No Babinski or clonus noted bilateral.   Musculoskeletal: No gross boney pedal deformities bilateral. No pain, crepitus, or limitation noted with foot and ankle range of motion bilateral. Muscular strength 5/5 in all groups tested bilateral.  Gait: Unassisted, Nonantalgic.    Radiographs:  None taken  Assessment & Plan:   Assessment: Nail dystrophy possible onychomycosis bilateral  Plan: Samples of skin they were taken today pathologic evaluation follow-up with her in 1 month     Hammad Finkler T. [place], [place]

## 2024-08-24 ENCOUNTER — Other Ambulatory Visit: Payer: Self-pay | Admitting: Podiatry

## 2024-08-29 ENCOUNTER — Ambulatory Visit: Payer: Self-pay | Admitting: Podiatry

## 2024-09-14 ENCOUNTER — Ambulatory Visit: Admitting: Podiatry
